# Patient Record
Sex: Female | Born: 1950
Health system: Southern US, Community
[De-identification: ages and names within clinical notes are randomized; demographics above are authoritative.]

## PROBLEM LIST (undated history)

## (undated) DIAGNOSIS — M8580 Other specified disorders of bone density and structure, unspecified site: Secondary | ICD-10-CM

## (undated) DIAGNOSIS — R1011 Right upper quadrant pain: Secondary | ICD-10-CM

## (undated) DIAGNOSIS — K219 Gastro-esophageal reflux disease without esophagitis: Secondary | ICD-10-CM

## (undated) DIAGNOSIS — F17200 Nicotine dependence, unspecified, uncomplicated: Secondary | ICD-10-CM

## (undated) HISTORY — DX: Other specified disorders of bone density and structure, unspecified site: M85.80

## (undated) HISTORY — DX: Gastro-esophageal reflux disease without esophagitis: K21.9

## (undated) HISTORY — DX: Right upper quadrant pain: R10.11

## (undated) HISTORY — DX: Nicotine dependence, unspecified, uncomplicated: F17.200

## (undated) HISTORY — PX: OTHER SURGICAL HISTORY: SHX169

---

## 1999-08-23 ENCOUNTER — Encounter: Payer: Self-pay | Admitting: Gynecology

## 1999-08-23 ENCOUNTER — Encounter: Admission: RE | Admit: 1999-08-23 | Discharge: 1999-08-23 | Payer: Self-pay | Admitting: Gynecology

## 2000-04-05 ENCOUNTER — Other Ambulatory Visit: Admission: RE | Admit: 2000-04-05 | Discharge: 2000-04-05 | Payer: Self-pay | Admitting: Gynecology

## 2000-04-05 ENCOUNTER — Encounter (INDEPENDENT_AMBULATORY_CARE_PROVIDER_SITE_OTHER): Payer: Self-pay

## 2000-08-23 ENCOUNTER — Encounter: Payer: Self-pay | Admitting: Gynecology

## 2000-08-23 ENCOUNTER — Encounter: Admission: RE | Admit: 2000-08-23 | Discharge: 2000-08-23 | Payer: Self-pay | Admitting: Gynecology

## 2001-02-05 ENCOUNTER — Encounter (INDEPENDENT_AMBULATORY_CARE_PROVIDER_SITE_OTHER): Payer: Self-pay | Admitting: Specialist

## 2001-02-05 ENCOUNTER — Other Ambulatory Visit: Admission: RE | Admit: 2001-02-05 | Discharge: 2001-02-05 | Payer: Self-pay | Admitting: Gynecology

## 2001-09-03 ENCOUNTER — Encounter: Payer: Self-pay | Admitting: Gynecology

## 2001-09-03 ENCOUNTER — Encounter: Admission: RE | Admit: 2001-09-03 | Discharge: 2001-09-03 | Payer: Self-pay | Admitting: Gynecology

## 2001-11-11 ENCOUNTER — Other Ambulatory Visit: Admission: RE | Admit: 2001-11-11 | Discharge: 2001-11-11 | Payer: Self-pay | Admitting: Gynecology

## 2002-09-30 ENCOUNTER — Encounter: Payer: Self-pay | Admitting: Gynecology

## 2002-09-30 ENCOUNTER — Encounter: Admission: RE | Admit: 2002-09-30 | Discharge: 2002-09-30 | Payer: Self-pay | Admitting: Gynecology

## 2002-11-18 ENCOUNTER — Other Ambulatory Visit: Admission: RE | Admit: 2002-11-18 | Discharge: 2002-11-18 | Payer: Self-pay | Admitting: Gynecology

## 2003-01-15 ENCOUNTER — Encounter: Payer: Self-pay | Admitting: Internal Medicine

## 2003-01-15 ENCOUNTER — Ambulatory Visit (HOSPITAL_COMMUNITY): Admission: RE | Admit: 2003-01-15 | Discharge: 2003-01-15 | Payer: Self-pay | Admitting: Internal Medicine

## 2003-09-14 ENCOUNTER — Other Ambulatory Visit: Admission: RE | Admit: 2003-09-14 | Discharge: 2003-09-14 | Payer: Self-pay | Admitting: Gynecology

## 2003-10-05 ENCOUNTER — Encounter: Admission: RE | Admit: 2003-10-05 | Discharge: 2003-10-05 | Payer: Self-pay | Admitting: Gynecology

## 2004-07-04 ENCOUNTER — Ambulatory Visit: Payer: Self-pay | Admitting: Internal Medicine

## 2004-10-13 ENCOUNTER — Encounter: Admission: RE | Admit: 2004-10-13 | Discharge: 2004-10-13 | Payer: Self-pay | Admitting: Gynecology

## 2005-03-21 ENCOUNTER — Ambulatory Visit: Payer: Self-pay | Admitting: Internal Medicine

## 2005-03-29 ENCOUNTER — Ambulatory Visit: Payer: Self-pay | Admitting: Internal Medicine

## 2005-03-29 ENCOUNTER — Other Ambulatory Visit: Admission: RE | Admit: 2005-03-29 | Discharge: 2005-03-29 | Payer: Self-pay | Admitting: Gynecology

## 2005-07-21 ENCOUNTER — Ambulatory Visit: Payer: Self-pay | Admitting: Gastroenterology

## 2005-08-10 ENCOUNTER — Ambulatory Visit: Payer: Self-pay | Admitting: Gastroenterology

## 2005-11-16 ENCOUNTER — Encounter: Admission: RE | Admit: 2005-11-16 | Discharge: 2005-11-16 | Payer: Self-pay | Admitting: Gynecology

## 2006-04-26 ENCOUNTER — Other Ambulatory Visit: Admission: RE | Admit: 2006-04-26 | Discharge: 2006-04-26 | Payer: Self-pay | Admitting: Gynecology

## 2006-05-22 ENCOUNTER — Ambulatory Visit: Payer: Self-pay | Admitting: Internal Medicine

## 2006-05-22 LAB — CONVERTED CEMR LAB
ALT: 32 units/L (ref 0–40)
AST: 28 units/L (ref 0–37)
Albumin: 3.7 g/dL (ref 3.5–5.2)
Alkaline Phosphatase: 63 units/L (ref 39–117)
Basophils Absolute: 0 10*3/uL (ref 0.0–0.1)
Basophils Relative: 0.5 % (ref 0.0–1.0)
Bilirubin Urine: NEGATIVE
Calcium: 9.2 mg/dL (ref 8.4–10.5)
Glomerular Filtration Rate, Af Am: 75 mL/min/{1.73_m2}
HCT: 40.7 % (ref 36.0–46.0)
Hemoglobin, Urine: NEGATIVE
LDL DIRECT: 112.7 mg/dL
Leukocytes, UA: NEGATIVE
Lymphocytes Relative: 39.4 % (ref 12.0–46.0)
Monocytes Absolute: 0.6 10*3/uL (ref 0.2–0.7)
Neutrophils Relative %: 42.8 % — ABNORMAL LOW (ref 43.0–77.0)
Potassium: 4.8 meq/L (ref 3.5–5.1)
Sodium: 142 meq/L (ref 135–145)
TSH: 3.01 microintl units/mL (ref 0.35–5.50)
Total Bilirubin: 0.8 mg/dL (ref 0.3–1.2)
Total Protein, Urine: NEGATIVE mg/dL
Total Protein: 6.8 g/dL (ref 6.0–8.3)
Triglyceride fasting, serum: 88 mg/dL (ref 0–149)
Urobilinogen, UA: 0.2 (ref 0.0–1.0)
VLDL: 18 mg/dL (ref 0–40)
WBC: 5.8 10*3/uL (ref 4.5–10.5)

## 2006-06-01 ENCOUNTER — Ambulatory Visit: Payer: Self-pay | Admitting: Internal Medicine

## 2006-11-15 ENCOUNTER — Encounter: Admission: RE | Admit: 2006-11-15 | Discharge: 2006-11-15 | Payer: Self-pay | Admitting: Gynecology

## 2007-05-08 ENCOUNTER — Ambulatory Visit: Payer: Self-pay | Admitting: Internal Medicine

## 2007-05-08 LAB — CONVERTED CEMR LAB
ALT: 28 units/L (ref 0–35)
AST: 25 units/L (ref 0–37)
Albumin: 3.7 g/dL (ref 3.5–5.2)
Alkaline Phosphatase: 66 units/L (ref 39–117)
BUN: 13 mg/dL (ref 6–23)
Basophils Absolute: 0 10*3/uL (ref 0.0–0.1)
Bilirubin, Direct: 0.1 mg/dL (ref 0.0–0.3)
Calcium: 8.8 mg/dL (ref 8.4–10.5)
Cholesterol: 200 mg/dL (ref 0–200)
Creatinine, Ser: 0.9 mg/dL (ref 0.4–1.2)
Eosinophils Relative: 5.8 % — ABNORMAL HIGH (ref 0.0–5.0)
GFR calc Af Amer: 84 mL/min
GFR calc non Af Amer: 69 mL/min
HDL: 72.8 mg/dL (ref >39.0)
Leukocytes, UA: NEGATIVE
Lymphocytes Relative: 40.9 % (ref 12.0–46.0)
MCV: 90.6 fL (ref 78.0–100.0)
Monocytes Relative: 9.7 % (ref 3.0–11.0)
Neutrophils Relative %: 42.7 % — ABNORMAL LOW (ref 43.0–77.0)
Nitrite: NEGATIVE
Platelets: 251 10*3/uL (ref 150–400)
RDW: 11.9 % (ref 11.5–14.6)
Sodium: 144 meq/L (ref 135–145)
Total Protein, Urine: NEGATIVE mg/dL
Total Protein: 6.6 g/dL (ref 6.0–8.3)
Triglycerides: 57 mg/dL (ref 0–149)
VLDL: 11 mg/dL (ref 0–40)
Vit D, 1,25-Dihydroxy: 24 (ref 20–57)
pH: 7 (ref 5.0–8.0)

## 2007-05-10 ENCOUNTER — Ambulatory Visit: Payer: Self-pay | Admitting: Internal Medicine

## 2007-05-15 ENCOUNTER — Encounter: Payer: Self-pay | Admitting: Internal Medicine

## 2007-05-15 ENCOUNTER — Ambulatory Visit: Payer: Self-pay | Admitting: Internal Medicine

## 2007-07-11 ENCOUNTER — Other Ambulatory Visit: Admission: RE | Admit: 2007-07-11 | Discharge: 2007-07-11 | Payer: Self-pay | Admitting: Gynecology

## 2007-12-09 ENCOUNTER — Encounter: Admission: RE | Admit: 2007-12-09 | Discharge: 2007-12-09 | Payer: Self-pay | Admitting: Gynecology

## 2008-06-25 ENCOUNTER — Other Ambulatory Visit: Admission: RE | Admit: 2008-06-25 | Discharge: 2008-06-25 | Payer: Self-pay | Admitting: Gynecology

## 2008-06-25 ENCOUNTER — Ambulatory Visit: Payer: Self-pay | Admitting: Internal Medicine

## 2008-06-25 LAB — CONVERTED CEMR LAB
Basophils Absolute: 0 10*3/uL (ref 0.0–0.1)
Basophils Relative: 0.7 % (ref 0.0–3.0)
CO2: 27 meq/L (ref 19–32)
Calcium: 9.3 mg/dL (ref 8.4–10.5)
Chloride: 105 meq/L (ref 96–112)
Eosinophils Relative: 6.3 % — ABNORMAL HIGH (ref 0.0–5.0)
HCT: 38.1 % (ref 36.0–46.0)
Hemoglobin, Urine: NEGATIVE
Leukocytes, UA: NEGATIVE
Lymphocytes Relative: 36.6 % (ref 12.0–46.0)
Monocytes Absolute: 0.5 10*3/uL (ref 0.1–1.0)
Neutrophils Relative %: 46.8 % (ref 43.0–77.0)
Platelets: 250 10*3/uL (ref 150–400)
Potassium: 5 meq/L (ref 3.5–5.1)
RDW: 12.1 % (ref 11.5–14.6)
Sodium: 145 meq/L (ref 135–145)
Specific Gravity, Urine: 1.015 (ref 1.000–1.03)
TSH: 2.09 microintl units/mL (ref 0.35–5.50)
Total Bilirubin: 0.6 mg/dL (ref 0.3–1.2)
Total CHOL/HDL Ratio: 2.5
Total Protein: 6.6 g/dL (ref 6.0–8.3)
Urine Glucose: NEGATIVE mg/dL
Urobilinogen, UA: 0.2 (ref 0.0–1.0)
pH: 6 (ref 5.0–8.0)

## 2008-06-30 ENCOUNTER — Ambulatory Visit: Payer: Self-pay | Admitting: Internal Medicine

## 2008-06-30 DIAGNOSIS — H9190 Unspecified hearing loss, unspecified ear: Secondary | ICD-10-CM | POA: Insufficient documentation

## 2008-06-30 DIAGNOSIS — Z87891 Personal history of nicotine dependence: Secondary | ICD-10-CM

## 2008-06-30 DIAGNOSIS — R1011 Right upper quadrant pain: Secondary | ICD-10-CM | POA: Insufficient documentation

## 2008-07-01 DIAGNOSIS — M8580 Other specified disorders of bone density and structure, unspecified site: Secondary | ICD-10-CM | POA: Insufficient documentation

## 2008-07-07 ENCOUNTER — Encounter: Payer: Self-pay | Admitting: Internal Medicine

## 2008-07-10 ENCOUNTER — Encounter: Admission: RE | Admit: 2008-07-10 | Discharge: 2008-07-10 | Payer: Self-pay | Admitting: Internal Medicine

## 2008-07-13 ENCOUNTER — Telehealth: Payer: Self-pay | Admitting: Internal Medicine

## 2008-12-17 ENCOUNTER — Encounter: Admission: RE | Admit: 2008-12-17 | Discharge: 2008-12-17 | Payer: Self-pay | Admitting: Gynecology

## 2009-05-04 ENCOUNTER — Ambulatory Visit: Payer: Self-pay | Admitting: Internal Medicine

## 2009-05-17 ENCOUNTER — Telehealth: Payer: Self-pay | Admitting: Internal Medicine

## 2009-09-15 ENCOUNTER — Ambulatory Visit: Payer: Self-pay | Admitting: Internal Medicine

## 2009-09-15 LAB — CONVERTED CEMR LAB
ALT: 36 units/L — ABNORMAL HIGH (ref 0–35)
AST: 29 units/L (ref 0–37)
Alkaline Phosphatase: 60 units/L (ref 39–117)
BUN: 12 mg/dL (ref 6–23)
Bilirubin Urine: NEGATIVE
CO2: 30 meq/L (ref 19–32)
Calcium: 9 mg/dL (ref 8.4–10.5)
Chloride: 109 meq/L (ref 96–112)
Creatinine, Ser: 0.9 mg/dL (ref 0.4–1.2)
Eosinophils Absolute: 0.3 10*3/uL (ref 0.0–0.7)
Eosinophils Relative: 5.4 % — ABNORMAL HIGH (ref 0.0–5.0)
HCT: 39.9 % (ref 36.0–46.0)
HDL: 69.4 mg/dL (ref >39.00)
Hemoglobin, Urine: NEGATIVE
Hemoglobin: 13.4 g/dL (ref 12.0–15.0)
Ketones, ur: NEGATIVE mg/dL
LDL Cholesterol: 98 mg/dL (ref 0–99)
Leukocytes, UA: NEGATIVE
Lymphocytes Relative: 40.1 % (ref 12.0–46.0)
Monocytes Absolute: 0.5 10*3/uL (ref 0.1–1.0)
Monocytes Relative: 10.4 % (ref 3.0–12.0)
Neutrophils Relative %: 43.1 % (ref 43.0–77.0)
RDW: 12.2 % (ref 11.5–14.6)
Total Bilirubin: 0.5 mg/dL (ref 0.3–1.2)
Total Protein, Urine: NEGATIVE mg/dL
Triglycerides: 81 mg/dL (ref 0.0–149.0)
VLDL: 16.2 mg/dL (ref 0.0–40.0)
Vit D, 25-Hydroxy: 32 ng/mL (ref 30–89)
WBC: 4.9 10*3/uL (ref 4.5–10.5)
pH: 6.5 (ref 5.0–8.0)

## 2009-09-24 ENCOUNTER — Ambulatory Visit: Payer: Self-pay | Admitting: Internal Medicine

## 2009-10-01 ENCOUNTER — Encounter: Payer: Self-pay | Admitting: Internal Medicine

## 2009-10-08 ENCOUNTER — Encounter: Admission: RE | Admit: 2009-10-08 | Discharge: 2009-10-08 | Payer: Self-pay | Admitting: Internal Medicine

## 2009-10-19 ENCOUNTER — Telehealth (INDEPENDENT_AMBULATORY_CARE_PROVIDER_SITE_OTHER): Payer: Self-pay | Admitting: *Deleted

## 2009-10-22 ENCOUNTER — Encounter: Payer: Self-pay | Admitting: Internal Medicine

## 2009-12-28 ENCOUNTER — Encounter: Admission: RE | Admit: 2009-12-28 | Discharge: 2009-12-28 | Payer: Self-pay | Admitting: Gynecology

## 2010-08-12 ENCOUNTER — Ambulatory Visit (HOSPITAL_COMMUNITY)
Admission: RE | Admit: 2010-08-12 | Discharge: 2010-08-12 | Payer: Self-pay | Source: Home / Self Care | Attending: Obstetrics and Gynecology | Admitting: Obstetrics and Gynecology

## 2010-08-26 ENCOUNTER — Encounter: Payer: Self-pay | Admitting: Gastroenterology

## 2010-09-08 NOTE — Letter (Signed)
Summary: Generic Letter  [facility]  [address] [place], [place] [address]   Phone: [phone]  Fax: [phone]    10/22/2009  [facility] [address] [place], [place]  [address]  Dear Ms. Brine,  Summary of Call: Please call patient: U/S ia ngeative for gallstone. For continued pain will need to have a heptao-biliary scan to look at the function of the gallbladder.  Please Call our office to have this testing set up.          Sincerely,   Ami Bullins CMA

## 2010-09-08 NOTE — Letter (Signed)
Summary: Colonoscopy Date Change Letter  [facility]  [address] [place], [place] [address]   Phone: [phone]  Fax: [phone]      August 26, 2010 MRN: [id number]   [facility] [address] [place], [place]  [address]   Dear Ms. Rester,   Previously you were recommended to have a repeat colonoscopy around this time. Your chart was recently reviewed by DR.Hadia Minier of  Gastroenterology. Follow up colonoscopy is now recommended in JAN 2017. This revised recommendation is based on current, nationally recognized guidelines for colorectal cancer screening and polyp surveillance. These guidelines are endorsed by the American Cancer Society, [employer] on Colorectal Cancer as well as numerous other major medical organizations.  Please understand that our recommendation assumes that you do not have any new symptoms such as bleeding, a change in bowel habits, anemia, or significant abdominal discomfort. If you do have any concerning GI symptoms or want to discuss the guideline recommendations, please call to arrange an office visit at your earliest convenience. Otherwise we will keep you in our reminder system and contact you 1-2 months prior to the date listed above to schedule your next colonoscopy.  Thank you,  Rachael Fee, M.D.  [facility] [phone]

## 2010-09-08 NOTE — Progress Notes (Signed)
  Phone Note Outgoing Call   Reason for Call: Discuss lab or test results Summary of Call: Please call patient: U/S ia ngeative for gallstone. For continued pain will need to have a heptao-biliary scan to look at the function of the gallbladder.  Thanks Initial call taken by: Jacques Navy MD,  October 19, 2009 5:20 PM  Follow-up for Phone Call        lmoam for pt to call back Follow-up by: Ami Bullins CMA,  October 20, 2009 10:02 AM

## 2010-09-08 NOTE — Letter (Signed)
Summary: Narda Bonds MD- ENT  Narda Bonds MD- ENT   Imported By: Sherian Rein 10/25/2009 10:32:40  _____________________________________________________________________  External Attachment:    Type:   Image     Comment:   External Document

## 2010-09-08 NOTE — Assessment & Plan Note (Signed)
Summary: CPX / NWS  #   Vital Signs:  Patient profile:   60 year old female Height:      67 inches Weight:      197 pounds BMI:     30.97 O2 Sat:      97 % on Room air Temp:     97.7 degrees F oral Pulse rate:   65 / minute BP sitting:   100 / 62  (left arm) Cuff size:   regular  Vitals Entered By: Bill Salinas CMA (September 24, 2009 1:25 PM)  O2 Flow:  Room air CC: pt here for cpx, she is due for mammogram and pap in april/ ab   Primary Care Provider:  Sorina Derrig  CC:  pt here for cpx and she is due for mammogram and pap in april/ ab.  History of Present Illness: Patient presents for routine exam. she has been doing well. She is concerned about her weight and looks forward to better weather. In the interval she has had steroid injection foot by Dr. Charlsie Merles - and this did help. No other problems. Due for Mammogram in April as is her Gyn exam.   She has had several episodes of reflux and also RUQ pain, fatty food intolerance with N/V, change in stool color. Father and sister have had GB disease.   Current Medications (verified): 1)  Multivitamins   Tabs (Multiple Vitamin) .... Take 1 By Mouth Qd 2)  Vitamin E 400 Unit Caps (Vitamin E) .... Take 1 Tablet By Mouth Once A Day 3)  Vitamin D 2000 Unit Tabs (Cholecalciferol) .... 6,000 Unit Daily 4)  Calcium 500 Mg Tabs (Calcium Carbonate) .... Take 2 Tablet By Mouth Once A Day 5)  Magnesium 500 Mg Tabs (Magnesium) .... As Needed  Allergies (verified): No Known Drug Allergies  Past History:  Past Medical History: Last updated: 06/30/2008 UCD OSTEOPENIA (ICD-733.90) Hx of TOBACCO ABUSE (ICD-305.1) HEARING LOSS (ICD-389.9) ABDOMINAL PAIN, RIGHT UPPER QUADRANT (ICD-789.01)   hysician roster: Dr. Leodis Rains for gynecology, Dr. Emily Filbert for dermatology.  Past Surgical History: Last updated: 05/17/2007 laporoscopy - fertility work-up  Family History: Last updated: 05-17-2007 father - died melanoma, mother-healthy brothers -healthy 3  sisters-healthy  Social History: Last updated: May 17, 2007 college graduate in Biology  Works- dental office married - 1989 things are going well  Review of Systems       More fatigued. Occasional pain in the right chest with referral to back. Not exertional. She has had a couple of severe heartburn and has had several episodes since, less severe.   Physical Exam  General:  WNWD softique female in no distress Head:  normocephalic, atraumatic, and no abnormalities observed.   Eyes:  vision grossly intact, pupils equal, pupils round, corneas and lenses clear, no injection, and no optic disk abnormalities.   Ears:  R ear normal and L ear normal.  Small nodule top of the right pinnae but no visible lesion  Nose:  no external deformity and no external erythema.   Mouth:  Oral mucosa and oropharynx without lesions or exudates.  Teeth in good repair. Neck:  full ROM, no thyromegaly, and no carotid bruits.  Easily palpable right submandibular salivary gland but no nodules or enlarged lymph nodes  Chest Wall:  no deformities.   Breasts:  deferred to gyn  Lungs:  Normal respiratory effort, chest expands symmetrically. Lungs are clear to auscultation, no crackles or wheezes. Heart:  Normal rate and regular rhythm. S1 and S2 normal without gallop, murmur,  click, rub or other extra sounds. Abdomen:  soft, normal bowel sounds, no masses, no guarding, and no rigidity.  Tender to deep palpation in the RUQ, tender to percussion that caused pain in the back.  Genitalia:  deferred   Impression & Recommendations:  Problem # 1:  ABDOMINAL PAIN, RIGHT UPPER QUADRANT (ICD-789.01) Recurrent RUQ pain with faty food intolerance, radiation of pain to the back, lighter stools. She has a positive family history for GB disease sister and father. She does have RUQ tenderness on exam. GB u/s 18+ months ago was normal.  Plan - repeat GB u/s           if negative will obtain HIDA scan  Orders: Radiology Referral  (Radiology)  Problem # 2:  HEARING LOSS (ICD-389.9) Significant hearing loss left ear. she has seen Dr. Dorma Russell but she is dissatisfied with the interaction and feels she would like another opinion about the etiology of her hearing loss and any potential treatment.  Plan - referral to Dr. Narda Bonds  Orders: ENT Referral (ENT)  Problem # 3:  Preventive Health Care (ICD-V70.0) She does remain current with her gynecologist for well-woman exams. Her exam today is normal. Lab results are within normal limits. Her last colonoscopy was in '07. She has had flu shot. Tetnus status is unkown.  In summary - a very nice woman who appears to be medically stable except for RUQ abdominal pain, work-up underway.   Complete Medication List: 1)  Multivitamins Tabs (Multiple vitamin) .... Take 1 by mouth qd 2)  Vitamin E 400 Unit Caps (Vitamin e) .... Take 1 tablet by mouth once a day 3)  Vitamin D 2000 Unit Tabs (Cholecalciferol) .... 6,000 unit daily 4)  Calcium 500 Mg Tabs (Calcium carbonate) .... Take 2 tablet by mouth once a day 5)  Magnesium 500 Mg Tabs (Magnesium) .... As needed   Patient: Sharon Mcgee Note: All result statuses are Final unless otherwise noted.  Tests: (1) BMP (METABOL)   Sodium                    144 mEq/L                   135-145   Potassium                 4.4 mEq/L                   3.5-5.1   Chloride                  109 mEq/L                   96-112   Carbon Dioxide            30 mEq/L                    19-32   Glucose                   91 mg/dL                    16-10   BUN                       12 mg/dL                    9-60   Creatinine  0.9 mg/dL                   0.4-5.4   Calcium                   9.0 mg/dL                   0.9-81.1   GFR                       68.66 mL/min                >60  Tests: (2) Lipid Panel (LIPID)   Cholesterol               184 mg/dL                   9-147     ATP III Classification            Desirable:  < 200  mg/dL                    Borderline High:  200 - 239 mg/dL               High:  > = 240 mg/dL   Triglycerides             81.0 mg/dL                  8.2-956.2     Normal:  <150 mg/dL     Borderline High:  130 - 199 mg/dL   HDL                       86.57 mg/dL                 >84.69   VLDL Cholesterol          16.2 mg/dL                  6.2-95.2   LDL Cholesterol           98 mg/dL                    8-41  CHO/HDL Ratio:  CHD Risk                             3                    Men          Women     1/2 Average Risk     3.4          3.3     Average Risk          5.0          4.4     2X Average Risk          9.6          7.1     3X Average Risk          15.0          11.0                           Tests: (3) CBC Platelet w/Diff (CBCD)   White Cell Count  4.9 K/uL                    4.5-10.5   Red Cell Count            4.20 Mil/uL                 3.87-5.11   Hemoglobin                13.4 g/dL                   40.9-81.1   Hematocrit                39.9 %                      36.0-46.0   MCV                       95.0 fl                     78.0-100.0   MCHC                      33.7 g/dL                   91.4-78.2   RDW                       12.2 %                      11.5-14.6   Platelet Count            245.0 K/uL                  150.0-400.0   Neutrophil %              43.1 %                      43.0-77.0   Lymphocyte %              40.1 %                      12.0-46.0   Monocyte %                10.4 %                      3.0-12.0   Eosinophils%         [H]  5.4 %                       0.0-5.0   Basophils %               1.0 %                       0.0-3.0   Neutrophill Absolute      2.1 K/uL                    1.4-7.7   Lymphocyte Absolute       2.0 K/uL                    0.7-4.0   Monocyte Absolute         0.5 K/uL  0.1-1.0  Eosinophils, Absolute                             0.3 K/uL                    0.0-0.7   Basophils Absolute         0.0 K/uL                    0.0-0.1  Tests: (4) Hepatic/Liver Function Panel (HEPATIC)   Total Bilirubin           0.5 mg/dL                   1.6-1.0   Direct Bilirubin          0.1 mg/dL                   9.6-0.4   Alkaline Phosphatase      60 U/L                      39-117   AST                       29 U/L                      0-37   ALT                  [H]  36 U/L                      0-35   Total Protein             7.1 g/dL                    5.4-0.9   Albumin                   4.0 g/dL                    8.1-1.9  Tests: (5) TSH (TSH)   FastTSH                   2.09 uIU/mL                 0.35-5.50  Tests: (6) UDip Only (UDIP)   Color                     DK. YELLOW       RANGE:  Yellow;Lt. Yellow   Clarity                   CLEAR                       Clear   Specific Gravity          1.015                       1.000 - 1.030   Urine Ph                  6.5                         5.0-8.0   Protein  NEGATIVE                    Negative   Urine Glucose             NEGATIVE                    Negative   Ketones                   NEGATIVE                    Negative   Urine Bilirubin           NEGATIVE                    Negative   Blood                     NEGATIVE                    Negative   Urobilinogen              0.2                         0.0 - 1.0   Leukocyte Esterace        NEGATIVE                    Negative   Nitrite                   NEGATIVE                    Negative Tests: (1) Vitamin D (25-Hydroxy) (11914)  Vitamin D (25-Hydroxy)                             32 ng/mL                    30-89     This assay accurately quantifies Vitamin D, which is the sum of the     25-Hydroxy forms of Vitamin D2 and D3.  Studies have shown that the     optimum concentration of 25-Hydroxy Vitamin D is 30 ng/mL or higher.      Concentrations of Vitamin D between 20 and 29 ng/mL are considered to     be insufficient and concentrations less than 20 ng/mL are  considered     to be deficient for Vitamin D.     Immunization History:  Influenza Immunization History:    Influenza:  historical (06/07/2009)

## 2010-09-22 ENCOUNTER — Encounter: Payer: Self-pay | Admitting: Internal Medicine

## 2010-09-22 ENCOUNTER — Other Ambulatory Visit: Payer: Self-pay

## 2010-09-22 ENCOUNTER — Encounter (INDEPENDENT_AMBULATORY_CARE_PROVIDER_SITE_OTHER): Payer: Self-pay | Admitting: *Deleted

## 2010-09-22 ENCOUNTER — Other Ambulatory Visit: Payer: Self-pay | Admitting: Internal Medicine

## 2010-09-22 DIAGNOSIS — Z0000 Encounter for general adult medical examination without abnormal findings: Secondary | ICD-10-CM

## 2010-09-22 DIAGNOSIS — E785 Hyperlipidemia, unspecified: Secondary | ICD-10-CM

## 2010-09-22 LAB — LIPID PANEL: Triglycerides: 131 mg/dL (ref 0.0–149.0)

## 2010-09-22 LAB — URINALYSIS
Bilirubin Urine: NEGATIVE
Ketones, ur: NEGATIVE
Leukocytes, UA: NEGATIVE
Total Protein, Urine: NEGATIVE
Urine Glucose: NEGATIVE
Urobilinogen, UA: 0.2 (ref 0.0–1.0)

## 2010-09-22 LAB — HEPATIC FUNCTION PANEL
ALT: 29 U/L (ref 0–35)
Albumin: 4 g/dL (ref 3.5–5.2)
Alkaline Phosphatase: 58 U/L (ref 39–117)
Bilirubin, Direct: 0.1 mg/dL (ref 0.0–0.3)
Total Protein: 6.8 g/dL (ref 6.0–8.3)

## 2010-09-22 LAB — CBC WITH DIFFERENTIAL/PLATELET
Basophils Relative: 0.5 % (ref 0.0–3.0)
Eosinophils Absolute: 0.3 10*3/uL (ref 0.0–0.7)
HCT: 40.1 % (ref 36.0–46.0)
Lymphocytes Relative: 39.9 % (ref 12.0–46.0)
Lymphs Abs: 2.3 10*3/uL (ref 0.7–4.0)
MCHC: 34.2 g/dL (ref 30.0–36.0)
MCV: 93 fl (ref 78.0–100.0)
Monocytes Absolute: 0.6 10*3/uL (ref 0.1–1.0)
Monocytes Relative: 10 % (ref 3.0–12.0)
Neutrophils Relative %: 43.7 % (ref 43.0–77.0)
Platelets: 237 10*3/uL (ref 150.0–400.0)
RDW: 12.9 % (ref 11.5–14.6)
WBC: 5.9 10*3/uL (ref 4.5–10.5)

## 2010-09-22 LAB — BASIC METABOLIC PANEL
Creatinine, Ser: 0.8 mg/dL (ref 0.4–1.2)
Potassium: 5.2 mEq/L — ABNORMAL HIGH (ref 3.5–5.1)
Sodium: 143 mEq/L (ref 135–145)

## 2010-09-22 LAB — CONVERTED CEMR LAB: Vit D, 25-Hydroxy: 30 ng/mL (ref 30–89)

## 2010-09-29 ENCOUNTER — Encounter: Payer: Self-pay | Admitting: Internal Medicine

## 2010-09-29 ENCOUNTER — Encounter (INDEPENDENT_AMBULATORY_CARE_PROVIDER_SITE_OTHER): Payer: BC Managed Care – PPO | Admitting: Internal Medicine

## 2010-09-29 DIAGNOSIS — K219 Gastro-esophageal reflux disease without esophagitis: Secondary | ICD-10-CM

## 2010-09-29 DIAGNOSIS — Z0000 Encounter for general adult medical examination without abnormal findings: Secondary | ICD-10-CM

## 2010-10-13 NOTE — Assessment & Plan Note (Signed)
Summary: PHYSICAL  ---STC   Vital Signs:  Patient profile:   60 year old female Height:      65.50 inches (166.37 cm) Weight:      197.75 pounds (89.89 kg) BMI:     32.52 O2 Sat:      96 % on Room air Temp:     98.7 degrees F (37.06 degrees C) oral Pulse rate:   69 / minute Resp:     14 per minute BP sitting:   102 / 68  (left arm) Cuff size:   regular  Vitals Entered By: Burnard Leigh CMA(AAMA) (September 29, 2010 1:54 PM)  O2 Flow:  Room air CC: Annual Wellness Exam/sls,cma Is Patient Diabetic? No Comments Pt states that she has been having worsening esophageal reflux, but it is under control w/OTC Zantac.   Primary Care Provider:  Yohannes Waibel  CC:  Annual Wellness Exam/sls and cma.  History of Present Illness: Sharon Mcgee presents for annual medical exam. In the interval she was evaluated for abdominal pain. This lead to hysteroscopy with removal of several bengin polyps and no complications.   She reports thatt she has been having increased reflux with several espisodes regurgitation. She denies dysphagia.   For health maintenance she has seen her gynecologist with a normal PAP. She had normal breast exam. She does have some trouble with stress incontinence.   Current Medications (verified): 1)  Multivitamins   Tabs (Multiple Vitamin) .... Take 1 By Mouth Qd 2)  Vitamin E 400 Unit Caps (Vitamin E) .... Take 1 Tablet By Mouth Once A Day 3)  Vitamin D 2000 Unit Tabs (Cholecalciferol) .... 6,000 Unit Daily 4)  Calcium 500 Mg Tabs (Calcium Carbonate) .... Take 2 Tablet By Mouth Once A Day 5)  Magnesium 500 Mg Tabs (Magnesium) .... As Needed 6)  Zantac 75 75 Mg Tabs (Ranitidine Hcl) .... Take As Needed.  Allergies (verified): No Known Drug Allergies  Past History:  Past Medical History: Last updated: 06/30/2008 UCD OSTEOPENIA (ICD-733.90) Hx of TOBACCO ABUSE (ICD-305.1) HEARING LOSS (ICD-389.9) ABDOMINAL PAIN, RIGHT UPPER QUADRANT (ICD-789.01)   hysician roster: Dr.  Leodis Rains for gynecology, Dr. Emily Filbert for dermatology.  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    Physical Exam  General:  Well-developed,well-nourished,in no acute distress; alert,appropriate and cooperative throughout examination Head:  Normocephalic and atraumatic without obvious abnormalities. No apparent alopecia or balding. Eyes:  vision grossly intact, pupils equal, pupils round, and corneas and lenses clear.   Ears:  External ear exam shows no significant lesions or deformities.  Otoscopic examination reveals minor amount of cerumen right and clear left, tympanic membranes are intact bilaterally without bulging, retraction, inflammation or discharge. Hearing is grossly normal bilaterally. Nose:  no external deformity, no external erythema, and no nasal discharge.   Mouth:  Oral mucosa and oropharynx without lesions or exudates.  Teeth in good repair. Neck:  supple, full ROM, no thyromegaly, and no carotid bruits.   Breasts:  deferred to gyn Lungs:  Normal respiratory effort,  chest expands symmetrically. Lungs are clear to auscultation, no crackles or wheezes. Heart:  Normal rate and regular rhythm. S1 and S2 normal without gallop, murmur, click, rub or other extra sounds. Abdomen:  soft, normal bowel sounds, and no guarding.   Genitalia:  deferred to  gyn Msk:  normal ROM, no joint tenderness, no joint swelling, no redness over joints, and no joint instability.   Pulses:  2+ radial Extremities:  No clubbing, cyanosis, edema, or deformity noted with normal full range of motion of all joints.   Neurologic:  alert & oriented X3, cranial nerves II-XII intact, strength normal in all extremities, sensation intact to pinprick, gait normal, and DTRs symmetrical and normal.   Skin:  turgor normal, color normal, and no suspicious lesions.   Cervical Nodes:  no anterior cervical adenopathy and no posterior cervical adenopathy.   Psych:  Oriented X3, memory intact for recent and remote, normally interactive, and not anxious appearing.     Impression & Recommendations:  Problem # 1:  GERD (ICD-530.81) Patient with significant symptoms that are intermittent.  Plan - liquid antacids with simethoicon           otc H2 blockers.           for unrelieved discomfort or increased frequency of symptoms - GI consult.  Her updated medication list for this problem includes:    Zantac 75 75 Mg Tabs (Ranitidine hcl) .[name]... Take as needed.  Problem # 2:  Preventive Health Care (ICD-V70.0) History remarkable for hysteroscopy with polyp0 removal as noted and new GI problems. Her limited physical exam was unremarkable. Lab results are within normal limits with an LDL 129.3 with goal of 130 or less (NCEP ATP III). She is currrent with colonsocopy Jan '07. She is current with gyn. She has had flu shot this season. 12 lead EKG without evidence of ischemia or injury.  In summary- a very nice woman who appears to be healthy. She is advised to increase he regular exercise and to be prudent in her diet. She will return as needed or in 1 year.   Complete Medication List: 1)  Multivitamins Tabs (Multiple vitamin) .... Take 1 by mouth qd 2)  Vitamin E 400 Unit Caps (Vitamin e) .... Take 1 tablet by mouth once a day 3)  Vitamin D 2000 Unit Tabs (Cholecalciferol) .... 6,000  unit daily 4)  Calcium 500 Mg Tabs (Calcium carbonate) .... Take 2 tablet by mouth once a day 5)  Magnesium 500 Mg Tabs (Magnesium) .... As needed 6)  Zantac 75 75 Mg Tabs (Ranitidine hcl) .... Take as needed.  Patient: Sharon Mcgee Note: All result statuses are Final unless otherwise noted.  Tests: (1) BMP (METABOL)   Sodium                    143 mEq/L                   135-145   Potassium            [H]  5.2 mEq/L                   3.5-5.1   Chloride                  109 mEq/L                   96-112   Carbon Dioxide  29 mEq/L                    19-32   Glucose                   90 mg/dL                    62-13   BUN                       14 mg/dL                    0-86   Creatinine                0.8 mg/dL                   5.7-8.4   Calcium                   9.3 mg/dL                   6.9-62.9   GFR                       84.43 mL/min                >60.00  Tests: (2) CBC Platelet w/Diff (CBCD)   White Cell Count          5.9 K/uL                    4.5-10.5   Red Cell Count            4.31 Mil/uL                 3.87-5.11   Hemoglobin                13.7 g/dL                   52.8-41.3   Hematocrit                40.1 %                      36.0-46.0   MCV                       93.0 fl                     78.0-100.0   MCHC                      34.2 g/dL                   24.4-01.0   RDW                       12.9 %                      11.5-14.6   Platelet Count            237.0 K/uL                  150.0-400.0   Neutrophil %              43.7 %  43.0-77.0   Lymphocyte %              39.9 %                      12.0-46.0   Monocyte %                10.0 %                      3.0-12.0   Eosinophils%         [H]  5.9 %                       0.0-5.0   Basophils %               0.5 %                       0.0-3.0   Neutrophill Absolute      2.6 K/uL                    1.4-7.7   Lymphocyte Absolute       2.3 K/uL                    0.7-4.0    Monocyte Absolute         0.6 K/uL                    0.1-1.0  Eosinophils, Absolute                             0.3 K/uL                    0.0-0.7   Basophils Absolute        0.0 K/uL                    0.0-0.1  Tests: (3) Hepatic/Liver Function Panel (HEPATIC)   Total Bilirubin           0.6 mg/dL                   1.6-1.0   Direct Bilirubin          0.1 mg/dL                   9.6-0.4   Alkaline Phosphatase      58 U/L                      39-117   AST                       26 U/L                      0-37   ALT                       29 U/L                      0-35   Total Protein             6.8 g/dL                    5.4-0.9   Albumin  4.0 g/dL                    1.6-1.0  Tests: (4) TSH (TSH)   FastTSH                   2.06 uIU/mL                 0.35-5.50  Tests: (5) Lipid Panel (LIPID)   Cholesterol          [H]  218 mg/dL                   9-604     ATP III Classification            Desirable:  < 200 mg/dL                    Borderline High:  200 - 239 mg/dL               High:  > = 240 mg/dL   Triglycerides             131.0 mg/dL                 5.4-098.1     Normal:  <150 mg/dL     Borderline High:  191 - 199 mg/dL   HDL                       47.82 mg/dL                 >95.62   VLDL Cholesterol          26.2 mg/dL                  1.3-08.6  CHO/HDL Ratio:  CHD Risk                             3                    Men          Women     1/2 Average Risk     3.4          3.3     Average Risk          5.0          4.4     2X Average Risk          9.6          7.1     3X Average Risk          15.0          11.0                           Tests: (6) UDip Only (UDIP)   Color                     YELLOW       RANGE:  Yellow;Lt. Yellow   Clarity                   CLEAR                       Clear   Specific Gravity          >=1.030  1.000 - 1.030   Urine Ph                  6.0                         5.0-8.0   Protein                    NEGATIVE                    Negative   Urine Glucose             NEGATIVE                    Negative   Ketones                   NEGATIVE                    Negative   Urine Bilirubin           NEGATIVE                    Negative   Blood                     NEGATIVE                    Negative   Urobilinogen              0.2                         0.0 - 1.0   Leukocyte Esterace        NEGATIVE                    Negative   Nitrite                   NEGATIVE                    Negative  Tests: (7) Cholesterol LDL - Direct (DIRLDL)  Cholesterol LDL - Direct                             129.4 mg/dL     Optimal:  <161 mg/dL     Near or Above Optimal:  100-129 mg/dL     Borderline High:  096-045 mg/dL     High:  409-811 mg/dL Tests: (1) Vitamin D (91-YNWGNFA) (21308)  Vitamin D (25-Hydroxy)                             30 ng/mL                    30-89  Orders Added: 1)  Est. Patient 40-64 years [99396] 2)  Est. Patient Level II [65784]

## 2010-10-17 LAB — CBC
MCH: 30.8 pg (ref 26.0–34.0)
MCV: 90.4 fL (ref 78.0–100.0)
Platelets: 233 10*3/uL (ref 150–400)
RBC: 4.25 MIL/uL (ref 3.87–5.11)
WBC: 7 10*3/uL (ref 4.0–10.5)

## 2010-10-24 ENCOUNTER — Ambulatory Visit (INDEPENDENT_AMBULATORY_CARE_PROVIDER_SITE_OTHER): Payer: BC Managed Care – PPO | Admitting: Internal Medicine

## 2010-10-24 ENCOUNTER — Encounter: Payer: Self-pay | Admitting: Internal Medicine

## 2010-10-24 DIAGNOSIS — J019 Acute sinusitis, unspecified: Secondary | ICD-10-CM

## 2010-11-03 NOTE — Assessment & Plan Note (Signed)
Summary: COUGH--FEVER---STC   Vital Signs:  Patient profile:   60 year old female Height:      65.50 inches Weight:      192 pounds BMI:     31.58 O2 Sat:      95 % on Room air Temp:     100.9 degrees F oral Pulse rate:   103 / minute BP sitting:   108 / 64  (left arm) Cuff size:   regular  Vitals Entered By: Bill Salinas CMA (October 24, 2010 10:32 AM)  O2 Flow:  Room air CC: pt here with 2 day history of fever (102), congestion, runny nose, sneezing, cold chills, weakness, headaches and body aches/ ab  Does patient need assistance? Ambulation Normal   Primary Care Provider:  Roselind Klus  CC:  pt here with 2 day history of fever (102), congestion, runny nose, sneezing, cold chills, weakness, and headaches and body aches/ ab.  History of Present Illness: Patient presents for a 3 day h/o URI symtoms with an increased fever over the past 24 hrs to 102F. She has started a Z-pak with 500mg  yesterday. she continues to have a cough, myalgias and congestion. No N/D/V  Current Medications (verified): 1)  Multivitamins   Tabs (Multiple Vitamin) .... Take 1 By Mouth Qd 2)  Vitamin E 400 Unit Caps (Vitamin E) .... Take 1 Tablet By Mouth Once A Day 3)  Vitamin D 2000 Unit Tabs (Cholecalciferol) .... 6,000 Unit Daily 4)  Calcium 500 Mg Tabs (Calcium Carbonate) .... Take 2 Tablet By Mouth Once A Day 5)  Magnesium 500 Mg Tabs (Magnesium) .... As Needed 6)  Zantac 75 75 Mg Tabs (Ranitidine Hcl) .... Take As Needed.  Allergies (verified): No Known Drug Allergies PMH-FH-SH reviewed-no changes except otherwise noted  Review of Systems  The patient denies anorexia, fever, weight loss, decreased hearing, chest pain, dyspnea on exertion, prolonged cough, abdominal pain, muscle weakness, difficulty walking, and enlarged lymph nodes.    Physical Exam  General:  Well-developed,well-nourished,in no acute distress; alert,appropriate and cooperative throughout examination Head:  minimal tenderness to  percussion over the sinuses Eyes:  vision grossly intact, pupils equal, and pupils round.   Ears:  R ear normal and L ear normal.   Neck:  supple and full ROM.   Chest Wall:  No deformities, masses, or tenderness noted. Lungs:  slight wheeze RUL. Otherwise normal exam Heart:  normal rate and regular rhythm.     Impression & Recommendations:  Problem # 1:  SINUSITIS, ACUTE (ICD-461.9) patient with fever and sinus congestion, but also arthraliga and myalgias. She has clear rhinorrhea. concerned for sinustis but also possible influenza like illness.  Plan - complete z-pak           Robitussin DM or equivalent; pehn/cod 1 tsp q6 for nighttime use or uncontrolled cough           Tamiflu 75mg  two times a day x 5           Usual comfort care.          Her updated medication list for this problem includes:    Zithromax Z-pak 250 Mg Tabs (Azithromycin)    Promethazine-codeine 6.25-10 Mg/29ml Syrp (Promethazine-codeine) .[name]... 1 tsp q6 as needed cough  Complete Medication List: 1)  Multivitamins Tabs (Multiple vitamin) .... Take 1 by mouth qd 2)  Vitamin E 400 Unit Caps (Vitamin e) .... Take 1 tablet by mouth once a day 3)  Vitamin D 2000 Unit Tabs (Cholecalciferol) .Marland KitchenMarland KitchenMarland Kitchen  6,000 unit daily 4)  Calcium 500 Mg Tabs (Calcium carbonate) .... Take 2 tablet by mouth once a day 5)  Magnesium 500 Mg Tabs (Magnesium) .... As needed 6)  Zantac 75 75 Mg Tabs (Ranitidine hcl) .... Take as needed. 7)  Zithromax Z-pak 250 Mg Tabs (Azithromycin) 8)  Tamiflu 75 Mg Caps (Oseltamivir phosphate) .[name].. 1 by mouth two times a day x 5 days for possible influenza 9)  Promethazine-codeine 6.25-10 Mg/69ml Syrp (Promethazine-codeine) .[name].. 1 tsp q6 as needed cough Prescriptions: PROMETHAZINE-CODEINE 6.25-10 MG/5ML SYRP (PROMETHAZINE-CODEINE) 1 tsp q6 as needed cough  #8 oz x 1   Entered and Authorized by:   Jacques Navy MD   Signed by:   Jacques Navy MD on 10/24/2010   Method used:   Handwritten   RxID:    [id number] TAMIFLU 75 MG CAPS (OSELTAMIVIR PHOSPHATE) 1 by mouth two times a day x 5 days for possible influenza  #10 x 0   Entered and Authorized by:   Jacques Navy MD   Signed by:   Jacques Navy MD on 10/24/2010   Method used:   Electronically to        [id number] (retail)       [address]       [place], [place]  [address]       Ph: [phone]       Fax: [phone]   RxID:   [id number]    Orders Added: 1)  Est. Patient Level III [74259]

## 2010-11-23 ENCOUNTER — Other Ambulatory Visit: Payer: Self-pay | Admitting: Gynecology

## 2010-11-23 DIAGNOSIS — Z1231 Encounter for screening mammogram for malignant neoplasm of breast: Secondary | ICD-10-CM

## 2010-12-23 NOTE — Assessment & Plan Note (Signed)
[facility]                             PRIMARY CARE OFFICE NOTE   NAME:Mcgee, Sharon RACZYNSKI                       MRN:          [id number]  DATE:06/01/2006                            DOB:          07-08-1951    Sharon Mcgee is a pleasant 60 year old woman who presents for followup  evaluation and examination.  She was last seen March 29, 2005.  Her past medical history, family history and social history are well  documented in that note.   INTERVAL HISTORY:  The patient had a good year with no change in her medical  condition.   INTERVAL SOCIAL HISTORY:  The patient was able to go [place] for a family  wedding and to see her mother for two weeks which was a great trip.  She  also had a one-week trip to [place] and was able to visit with her  extended family.  The patient reports things are stable, both at work and at  home although it has been a bad year for gardening.   MEDICATIONS:  Vitamins only.   HEALTH MAINTENANCE:  The patient has been seen by Dr. Teodora Medici in  September 2007 with a normal pelvic and Pap smear.  She did have a normal  mammogram by her report in February 2007.   CHART REVIEW:  The patient had a normal EKG in August 2006. The patient had  a chest x-ray January 15, 2003 which showed no active disease.  The patient had  colonoscopy August 10, 2005 which was a negative study except for internal  and external hemorrhoids and mild diverticulosis.   REVIEW OF SYSTEMS:  Negative for any constitutional, cardiovascular,  respiratory, GI, GU problems.   PHYSICAL EXAMINATION:  VITAL SIGNS:  Temperature 97.6, blood pressure  113/78, pulse 67, weight 186, height 5 feet 7 inches.  GENERAL APPEARANCE: This is a well-developed, well-nourished woman in no  acute distress.  HEENT:  Normocephalic, atraumatic.  Tympanic membranes normal.  Oropharynx  negative.  Dentition in good repair.  No buccal or palate lesions were  noted.   Posterior pharynx was clear.  Conjunctivae and sclerae clear.  Pupils are equal, round and reactive to light and accommodation.  Funduscopic exam deferred to ophthalmology.  NECK:  Supple without thyromegaly.  NODES:  No adenopathy was noted in the cervical or supraclavicular regions.  CHEST:  No CVA tenderness.  Lungs were clear to auscultation and percussion.  BREASTS:  Deferred to gynecology.  CARDIOVASCULAR:  2+ radial pulses.  No JVD or carotid bruits.  Quiet  precordium is quiet with regular rate and rhythm without murmurs, rubs or  gallops.  ABDOMEN:  Soft. No guarding or rebound.  No organomegaly or splenomegaly was  appreciated.  PELVIC/RECTAL:  Deferred to gynecology,.  EXTREMITIES:  Without clubbing, cyanosis or edema or deformity.  NEUROLOGIC:  Nonfocal.   DATA REVIEWED:  Hemoglobin 13.9, white count 5800 with differential showing  6.7% eosinophils.  Cholesterol 212, triglycerides 88, HDL 63.4, LDL 112.7.  Chemistries were unremarkable.  Serum glucose 95, creatinine 1, glomerular  filtration rate is 62  mL per minute.  Liver functions were normal.  TSH was  normal at 3.01.  Urinalysis was negative.   ASSESSMENT/PLAN:  This is a healthy woman with no abnormal findings on  examination.  Her lipid profile is excellent.  The patient grows her own  vegetables. She uses olive oil including homemade olive oil from [place].  She gets active exercise. She has plenty of sun exposure.  We discussed a  DEXA scan and given her frame size, level of activity and well-balanced  diet, I did not feel that this was required nor was a vitamin D level  required.   The patient is asked to return to see me in one year.  At her request we  will remember to add a vitamin D level to her routine labs.  She will return  to see me otherwise on a p.r.n. basis.    ______________________________  Rosalyn Gess Norins, MD    MEN/MedQ  DD: 06/01/2006  DT: 06/03/2006  Job #: [id number]   cc:   Leatha Gilding.  Mezer, M.D.  Sharon Mcgee

## 2011-01-05 ENCOUNTER — Ambulatory Visit
Admission: RE | Admit: 2011-01-05 | Discharge: 2011-01-05 | Disposition: A | Discharge status: Home / Self Care | Payer: BC Managed Care – PPO | Source: Ambulatory Visit | Attending: Gynecology | Admitting: Gynecology

## 2011-01-05 DIAGNOSIS — Z1231 Encounter for screening mammogram for malignant neoplasm of breast: Secondary | ICD-10-CM

## 2011-04-26 ENCOUNTER — Telehealth: Payer: Self-pay | Admitting: *Deleted

## 2011-04-26 ENCOUNTER — Encounter: Payer: Self-pay | Admitting: Endocrinology

## 2011-04-26 NOTE — Telephone Encounter (Signed)
Spoke w/patient. She c/o lightheadedness off and on x 2 wks. BP this am 111/73 pulse 90. She takes no meds, only otc Vitamins. No fever or ear complaints. She has had some sinus drainage in the last week. Pt also had chest discomfort x 2 different times lasting only a second or two. NO SOB. Advised she call office w/any change in symptoms or go to ER with increase or severe symptoms. Pt agreed and otherwise will keep apt tomorrow for eval.

## 2011-04-27 ENCOUNTER — Ambulatory Visit (INDEPENDENT_AMBULATORY_CARE_PROVIDER_SITE_OTHER): Payer: BC Managed Care – PPO | Admitting: Endocrinology

## 2011-04-27 ENCOUNTER — Encounter: Payer: Self-pay | Admitting: Endocrinology

## 2011-04-27 VITALS — BP 112/82 | HR 62 | Temp 98.2°F | Ht 67.0 in | Wt 194.0 lb

## 2011-04-27 DIAGNOSIS — R51 Headache: Secondary | ICD-10-CM | POA: Insufficient documentation

## 2011-04-27 DIAGNOSIS — R42 Dizziness and giddiness: Secondary | ICD-10-CM

## 2011-04-27 DIAGNOSIS — R519 Headache, unspecified: Secondary | ICD-10-CM | POA: Insufficient documentation

## 2011-04-27 MED ORDER — CEFUROXIME AXETIL 500 MG PO TABS: 500.0000 mg | ORAL_TABLET | Freq: Two times a day (BID) | ORAL | Status: AC

## 2011-04-27 NOTE — Patient Instructions (Addendum)
i have sent a prescription to your pharmacy, for an antibiotic. I hope you feel better soon.  If you don't feel better by next week, please call doctor norins.

## 2011-04-27 NOTE — Progress Notes (Signed)
Subjective:    Patient ID: Sharon Mcgee, female    DOB: 1950-10-07, 60 y.o.   MRN: [id number]  HPI Pt states 10 days of intermittent moderate dizziness sensation in the head. It happens approx once daily, and lasts a few seconds at a time, but yesterday, it lasted a few hrs.  On this occasion, she has assoc nausea.  She has some nasal congestion. Past Medical History  Diagnosis Date  . Osteopenia   . Tobacco use disorder   . Hearing loss   . Abdominal pain, RUQ     Past Surgical History  Procedure Date  . Laproscopy     fertility work up    History   Social History  . Marital Status: Married    Spouse Name: N/A    Number of Children: N/A  . Years of Education: N/A   Occupational History  . Not on file.   Social History Main Topics  . Smoking status: Never Smoker   . Smokeless tobacco: Not on file  . Alcohol Use: Not on file  . Drug Use: Not on file  . Sexually Active: Not on file   Other Topics Concern  . Not on file   Social History Narrative   College Grad in BiologyWorks- Dental OfficeMarried [phone] are going well    Current Outpatient Prescriptions on File Prior to Visit  Medication Sig Dispense Refill  . Calcium Carbonate-Vitamin D (CALCIUM 500 + D PO) Take by mouth.        . magnesium gluconate (MAGONATE) 500 MG tablet Take 500 mg by mouth 2 (two) times daily.        . MULTIPLE VITAMIN PO Take 1 tablet by mouth.        . ranitidine (ZANTAC) 75 MG tablet Take 75 mg by mouth 2 (two) times daily.          No Known Allergies  Family History  Problem Relation Age of Onset  . Melanoma Father     BP 112/82  Pulse 62  Temp(Src) 98.2 F (36.8 C) (Oral)  Ht 5\' 7"  (1.702 m)  Wt 194 lb (87.998 kg)  BMI 30.38 kg/m2  SpO2 99%    Review of Systems  Constitutional: Negative for fever.  HENT: Positive for congestion. Negative for hearing loss.   Eyes: Negative for visual disturbance.  Respiratory:       Slight dry cough  Cardiovascular: Negative  for chest pain.  Gastrointestinal: Negative for anal bleeding.  Genitourinary: Negative for hematuria.  Skin: Negative for rash.  Neurological: Negative for syncope.       She had slight occipital headache on 1 occasion       Objective:   Physical Exam VS: see vs page GEN: no distress HEAD: head: no deformity eyes: no periorbital swelling, no proptosis external nose and ears are normal mouth: no lesion seen Both eac's and tm's are normal. NECK: supple, thyroid is not enlarged CHEST WALL: no deformity LUNGS:  Clear to auscultation CV: reg rate and rhythm, no murmur MUSCULOSKELETAL: muscle bulk and strength are grossly normal.  no obvious joint swelling.  gait is normal and steady EXTEMITIES: no deformity.  no ulcer on the feet.  feet are of normal color and temp.  no edema PULSES: dorsalis pedis intact bilat.  no carotid bruit NEURO:  cn 2-12 grossly intact.   readily moves all 4's.  sensation is intact to touch on the feet SKIN:  Normal texture and temperature.   NODES:  None  palpable at the neck PSYCH: alert, oriented x3.  Does not appear anxious nor depressed.     i reviewed electrocardiogram (no change since 2/12)    Assessment & Plan:  Dizziness, uncertain etiology.  New Poss uri--? Cause of sxs

## 2011-10-16 ENCOUNTER — Ambulatory Visit (INDEPENDENT_AMBULATORY_CARE_PROVIDER_SITE_OTHER): Payer: BC Managed Care – PPO | Admitting: Internal Medicine

## 2011-10-16 ENCOUNTER — Encounter: Payer: Self-pay | Admitting: Internal Medicine

## 2011-10-16 VITALS — BP 98/62 | HR 64 | Temp 97.2°F | Resp 14 | Ht 66.25 in | Wt 192.2 lb

## 2011-10-16 DIAGNOSIS — F17200 Nicotine dependence, unspecified, uncomplicated: Secondary | ICD-10-CM

## 2011-10-16 DIAGNOSIS — E559 Vitamin D deficiency, unspecified: Secondary | ICD-10-CM

## 2011-10-16 DIAGNOSIS — Z0000 Encounter for general adult medical examination without abnormal findings: Secondary | ICD-10-CM | POA: Insufficient documentation

## 2011-10-16 DIAGNOSIS — K219 Gastro-esophageal reflux disease without esophagitis: Secondary | ICD-10-CM

## 2011-10-16 MED ORDER — HYDROCORTISONE 2.5 % RE CREA: TOPICAL_CREAM | Freq: Two times a day (BID) | RECTAL | Status: DC

## 2011-10-16 NOTE — Progress Notes (Signed)
Subjective:    Patient ID: Sharon Mcgee, female    DOB: 09-04-1950, 61 y.o.   MRN: [id number]  HPI Sharon Mcgee presents for follow-up exam and annual visit. She has had a good year: no major illness, no surgery and no injuries. She has an appointment next week with Gyn. Due for mammogram in May.   Past Medical History  Diagnosis Date  . Osteopenia   . Tobacco use disorder   . Hearing loss   . Abdominal pain, RUQ    Past Surgical History  Procedure Date  . Laproscopy     fertility work up  . Uterine polypectomy    Family History  Problem Relation Age of Onset  . Melanoma Father    History   Social History  . Marital Status: Married    Spouse Name: N/A    Number of Children: N/A  . Years of Education: N/A   Occupational History  . Not on file.   Social History Main Topics  . Smoking status: Never Smoker   . Smokeless tobacco: Never Used  . Alcohol Use: Yes  . Drug Use: No  . Sexually Active: Yes -- Female partner(s)   Other Topics Concern  . Not on file   Social History Narrative   HSG. College Grad in Building surveyor. Married '89. Works- Theme park manager. Avid gardner. Travels to [place] to see her mother every year.Things are going well       Review of Systems Constitutional:  Negative for fever, chills, activity change and unexpected weight change.  HEENT:  Negative for hearing loss, ear pain, congestion, neck stiffness and postnasal drip. Negative for sore throat or swallowing problems. Negative for dental complaints.   Eyes: Negative for vision loss or change in visual acuity.  Respiratory: Negative for chest tightness and wheezing. Negative for DOE.   Cardiovascular: Negative for chest pain or palpitations. No decreased exercise tolerance Gastrointestinal: No change in bowel habit. No bloating or gas. No reflux or indigestion. Mild flare of hemorrhoids Genitourinary: Negative for urgency, frequency, flank pain and difficulty urinating.  Musculoskeletal: Negative for  myalgias, back pain, arthralgias and gait problem.  Neurological: Negative for dizziness, tremors, weakness and headaches.  Hematological: Negative for adenopathy.  Psychiatric/Behavioral: Negative for behavioral problems and dysphoric mood.       Objective:   Physical Exam Filed Vitals:   10/16/11 1348  BP: 98/62  Pulse: 64  Temp: 97.2 F (36.2 C)  Resp: 14   Wt Readings from Last 3 Encounters:  10/16/11 192 lb 4 oz (87.204 kg)  04/27/11 194 lb (87.998 kg)  10/24/10 192 lb (87.091 kg)    Gen'l: well nourished, well developed white woman in no distress HEENT - [place]/AT, EACs/TMs normal, oropharynx with native dentition in good condition, no buccal or palatal lesions, posterior pharynx clear, mucous membranes moist. C&S clear, PERRLA, fundi - normal Neck - supple, no thyromegaly Nodes- negative submental, cervical, supraclavicular regions Chest - no deformity, no CVAT Lungs - cleat without rales, wheezes. No increased work of breathing Breast - deferred to gyn and mammography Cardiovascular - regular rate and rhythm, quiet precordium, no murmurs, rubs or gallops, 2+ radial, DP and PT pulses Abdomen - BS+ x 4, no HSM, no guarding or rebound or tenderness Pelvic - deferred to gyn Rectal - deferred to gyn Extremities - no clubbing, cyanosis, edema or deformity.  Neuro - A&O x 3, CN II-XII normal, motor strength normal and equal, DTRs 2+ and symmetrical biceps, radial, and patellar tendons.  Cerebellar - no tremor, no rigidity, fluid movement and normal gait. Derm - Head, neck, back, abdomen and extremities without suspicious lesions  Lab Results  Component Value Date   WBC 5.7 10/18/2011   HGB 13.6 10/18/2011   HCT 40.6 10/18/2011   PLT 244.0 10/18/2011   GLUCOSE 92 10/18/2011   CHOL 166 10/18/2011   TRIG 105.0 10/18/2011   HDL 51.20 10/18/2011   LDLDIRECT 129.4 09/22/2010   LDLCALC 94 10/18/2011        ALT 35 10/18/2011   AST 24 10/18/2011        NA 142 10/18/2011   K 4.4 10/18/2011    CL 108 10/18/2011   CREATININE 0.9 10/18/2011   BUN 16 10/18/2011   CO2 28 10/18/2011   TSH 1.83 10/18/2011          Assessment & Plan:

## 2011-10-16 NOTE — Assessment & Plan Note (Signed)
Interval history is unremarkable except for minor hemorrhoidal itch. Anusol HC 2.5% Rx. Physical exam, sans breast and pelvic, normal. She is current with gyn with appointment soon. Mammogram current. Current for colorectal cancer screening. Immunization is up to date.   In summary - a delightful woman who appears to be in robust help. She will return in 1 year or sooner as needed.

## 2011-10-16 NOTE — Assessment & Plan Note (Signed)
Encouraged to not smoke.

## 2011-10-16 NOTE — Assessment & Plan Note (Signed)
Vit D 25 hydrox  2010 32; 2011 30.   Plan - repeat Vit D level           Take Vit D 1,000 IU daily

## 2011-10-17 NOTE — Assessment & Plan Note (Signed)
Stable and doing well. 

## 2011-10-18 ENCOUNTER — Other Ambulatory Visit (INDEPENDENT_AMBULATORY_CARE_PROVIDER_SITE_OTHER): Payer: BC Managed Care – PPO

## 2011-10-18 DIAGNOSIS — Z0000 Encounter for general adult medical examination without abnormal findings: Secondary | ICD-10-CM

## 2011-10-18 LAB — CBC WITH DIFFERENTIAL/PLATELET
Basophils Absolute: 0 10*3/uL (ref 0.0–0.1)
Basophils Relative: 0.7 % (ref 0.0–3.0)
Eosinophils Absolute: 0.3 10*3/uL (ref 0.0–0.7)
HCT: 40.6 % (ref 36.0–46.0)
Hemoglobin: 13.6 g/dL (ref 12.0–15.0)
Lymphs Abs: 2 10*3/uL (ref 0.7–4.0)
MCHC: 33.5 g/dL (ref 30.0–36.0)
Neutro Abs: 2.8 10*3/uL (ref 1.4–7.7)
RBC: 4.39 Mil/uL (ref 3.87–5.11)
RDW: 12.4 % (ref 11.5–14.6)

## 2011-10-18 LAB — COMPREHENSIVE METABOLIC PANEL
ALT: 35 U/L (ref 0–35)
Albumin: 4.1 g/dL (ref 3.5–5.2)
Alkaline Phosphatase: 53 U/L (ref 39–117)
CO2: 28 mEq/L (ref 19–32)
Glucose, Bld: 92 mg/dL (ref 70–99)
Potassium: 4.4 mEq/L (ref 3.5–5.1)
Sodium: 142 mEq/L (ref 135–145)
Total Bilirubin: 0.3 mg/dL (ref 0.3–1.2)
Total Protein: 6.9 g/dL (ref 6.0–8.3)

## 2011-10-18 LAB — LIPID PANEL
HDL: 51.2 mg/dL (ref >39.00)
Total CHOL/HDL Ratio: 3
VLDL: 21 mg/dL (ref 0.0–40.0)

## 2011-10-18 LAB — HEPATIC FUNCTION PANEL
AST: 24 U/L (ref 0–37)
Albumin: 4.1 g/dL (ref 3.5–5.2)
Alkaline Phosphatase: 53 U/L (ref 39–117)
Total Protein: 6.9 g/dL (ref 6.0–8.3)

## 2011-10-18 LAB — TSH: TSH: 1.83 u[IU]/mL (ref 0.35–5.50)

## 2011-10-19 LAB — VITAMIN D 25 HYDROXY (VIT D DEFICIENCY, FRACTURES): Vit D, 25-Hydroxy: 44 ng/mL (ref 30–89)

## 2011-12-05 ENCOUNTER — Encounter: Payer: BC Managed Care – PPO | Admitting: Internal Medicine

## 2011-12-19 ENCOUNTER — Other Ambulatory Visit: Payer: Self-pay | Admitting: Gynecology

## 2011-12-19 DIAGNOSIS — Z1231 Encounter for screening mammogram for malignant neoplasm of breast: Secondary | ICD-10-CM

## 2012-01-16 ENCOUNTER — Ambulatory Visit
Admission: RE | Admit: 2012-01-16 | Discharge: 2012-01-16 | Disposition: A | Discharge status: Home / Self Care | Payer: BC Managed Care – PPO | Source: Ambulatory Visit | Attending: Gynecology | Admitting: Gynecology

## 2012-01-16 DIAGNOSIS — Z1231 Encounter for screening mammogram for malignant neoplasm of breast: Secondary | ICD-10-CM

## 2012-01-22 ENCOUNTER — Other Ambulatory Visit: Payer: Self-pay | Admitting: Gynecology

## 2012-01-22 DIAGNOSIS — R928 Other abnormal and inconclusive findings on diagnostic imaging of breast: Secondary | ICD-10-CM

## 2012-01-29 ENCOUNTER — Ambulatory Visit
Admission: RE | Admit: 2012-01-29 | Discharge: 2012-01-29 | Disposition: A | Discharge status: Home / Self Care | Payer: BC Managed Care – PPO | Source: Ambulatory Visit | Attending: Gynecology | Admitting: Gynecology

## 2012-01-29 DIAGNOSIS — R928 Other abnormal and inconclusive findings on diagnostic imaging of breast: Secondary | ICD-10-CM

## 2012-04-19 ENCOUNTER — Encounter: Payer: Self-pay | Admitting: Gastroenterology

## 2012-06-04 ENCOUNTER — Encounter: Payer: Self-pay | Admitting: Gastroenterology

## 2012-06-10 ENCOUNTER — Telehealth: Payer: Self-pay | Admitting: *Deleted

## 2012-06-10 NOTE — Telephone Encounter (Signed)
Patient denies any GI complaints. Per Dr.Jacobs recall assessment sheet in chart recall due 08/2015. Notified patient of this, cancelled current appointments and placed patient in our recall system. Patient understands.

## 2012-06-21 ENCOUNTER — Other Ambulatory Visit: Payer: BC Managed Care – PPO | Admitting: Gastroenterology

## 2012-11-28 ENCOUNTER — Other Ambulatory Visit: Payer: Self-pay

## 2012-11-28 DIAGNOSIS — Z1231 Encounter for screening mammogram for malignant neoplasm of breast: Secondary | ICD-10-CM

## 2013-01-13 ENCOUNTER — Ambulatory Visit
Admission: RE | Admit: 2013-01-13 | Discharge: 2013-01-13 | Disposition: A | Discharge status: Home / Self Care | Payer: BC Managed Care – PPO | Source: Ambulatory Visit

## 2013-01-13 DIAGNOSIS — Z1231 Encounter for screening mammogram for malignant neoplasm of breast: Secondary | ICD-10-CM

## 2013-06-19 ENCOUNTER — Other Ambulatory Visit (INDEPENDENT_AMBULATORY_CARE_PROVIDER_SITE_OTHER): Payer: BC Managed Care – PPO

## 2013-06-19 ENCOUNTER — Encounter: Payer: Self-pay | Admitting: Internal Medicine

## 2013-06-19 ENCOUNTER — Ambulatory Visit (INDEPENDENT_AMBULATORY_CARE_PROVIDER_SITE_OTHER): Payer: BC Managed Care – PPO | Admitting: Internal Medicine

## 2013-06-19 VITALS — BP 110/82 | HR 60 | Temp 97.5°F | Ht 66.5 in | Wt 198.4 lb

## 2013-06-19 DIAGNOSIS — Z0000 Encounter for general adult medical examination without abnormal findings: Secondary | ICD-10-CM

## 2013-06-19 DIAGNOSIS — F17200 Nicotine dependence, unspecified, uncomplicated: Secondary | ICD-10-CM

## 2013-06-19 DIAGNOSIS — M899 Disorder of bone, unspecified: Secondary | ICD-10-CM

## 2013-06-19 DIAGNOSIS — H9190 Unspecified hearing loss, unspecified ear: Secondary | ICD-10-CM

## 2013-06-19 LAB — LIPID PANEL
Cholesterol: 228 mg/dL — ABNORMAL HIGH (ref 0–200)
HDL: 84 mg/dL (ref >39.00)
Total CHOL/HDL Ratio: 3
Triglycerides: 46 mg/dL (ref 0.0–149.0)
VLDL: 9.2 mg/dL (ref 0.0–40.0)

## 2013-06-19 LAB — HEPATIC FUNCTION PANEL
ALT: 64 U/L — ABNORMAL HIGH (ref 0–35)
AST: 42 U/L — ABNORMAL HIGH (ref 0–37)
Alkaline Phosphatase: 64 U/L (ref 39–117)
Bilirubin, Direct: 0.1 mg/dL (ref 0.0–0.3)
Total Bilirubin: 0.5 mg/dL (ref 0.3–1.2)
Total Protein: 7.3 g/dL (ref 6.0–8.3)

## 2013-06-19 LAB — COMPREHENSIVE METABOLIC PANEL
Alkaline Phosphatase: 64 U/L (ref 39–117)
BUN: 13 mg/dL (ref 6–23)
CO2: 29 mEq/L (ref 19–32)
Creatinine, Ser: 0.8 mg/dL (ref 0.4–1.2)
GFR: 77.64 mL/min (ref >60.00)
Glucose, Bld: 97 mg/dL (ref 70–99)
Total Bilirubin: 0.5 mg/dL (ref 0.3–1.2)
Total Protein: 7.3 g/dL (ref 6.0–8.3)

## 2013-06-19 LAB — CBC WITH DIFFERENTIAL/PLATELET
Basophils Absolute: 0 10*3/uL (ref 0.0–0.1)
Eosinophils Absolute: 0.2 10*3/uL (ref 0.0–0.7)
Hemoglobin: 13.6 g/dL (ref 12.0–15.0)
Lymphocytes Relative: 37.3 % (ref 12.0–46.0)
MCHC: 34.5 g/dL (ref 30.0–36.0)
Neutro Abs: 3 10*3/uL (ref 1.4–7.7)
Platelets: 253 10*3/uL (ref 150.0–400.0)
RDW: 13.1 % (ref 11.5–14.6)

## 2013-06-19 LAB — LDL CHOLESTEROL, DIRECT: Direct LDL: 129.9 mg/dL

## 2013-06-19 NOTE — Patient Instructions (Signed)
Good to see you. It seems you have had a good year.  Please take the Vitamin D every day.   Please make time to exercise at least 3 times a week.  For routine lab today and results will be posted to MyChart  Check you insurance coverage for Shingles vaccine: if covered make a nurse visit for the shot.  Stay well and have a good winter.

## 2013-06-19 NOTE — Progress Notes (Signed)
Subjective:    Patient ID: Sharon Mcgee, female    DOB: November 20, 1950, 62 y.o.   MRN: [id number]  HPI Sharon Mcgee presents for an annual exam. She has been doing well. In the interval she has been well with no major illness or injury. She has seen Dr. Chevis Pretty in August with a normal exam and had mammogram in June. She reports having colonoscopy in 2007 - no path report, therefore due in 2017. She has had some problem with anal pruritis due to hemorrhoids.  She is current with dental care. She has had an eye exam in the last 12 months. Healthy diet.  Not making time for exercise - it is on the list.   Past Medical History  Diagnosis Date  . Osteopenia   . Tobacco use disorder   . Hearing loss   . Abdominal pain, RUQ    Past Surgical History  Procedure Laterality Date  . Laproscopy      fertility work up  . Uterine polypectomy     Family History  Problem Relation Age of Onset  . Melanoma Father    History   Social History  . Marital Status: Married    Spouse Name: N/A    Number of Children: N/A  . Years of Education: N/A   Occupational History  . Not on file.   Social History Main Topics  . Smoking status: Never Smoker   . Smokeless tobacco: Never Used  . Alcohol Use: Yes  . Drug Use: No  . Sexual Activity: Yes    Partners: Male   Other Topics Concern  . Not on file   Social History Narrative   [employer]. College Grad in Building surveyor. Married '89. Works- Theme park manager. Avid gardner. Travels to [place] to see her mother every year.Things are going well    Current Outpatient Prescriptions on File Prior to Visit  Medication Sig Dispense Refill  . magnesium gluconate (MAGONATE) 500 MG tablet Take 500 mg by mouth 2 (two) times daily.        . ranitidine (ZANTAC) 75 MG tablet Take 75 mg by mouth 2 (two) times daily as needed.        No current facility-administered medications on file prior to visit.      Review of Systems Constitutional:  Negative for fever, chills, activity  change and unexpected weight change.  HEENT:  Positive for hearing loss 70% left ear. no ear pain, congestion, neck stiffness and postnasal drip. Negative for sore throat or swallowing problems. Negative for dental complaints.   Eyes: Negative for vision loss or change in visual acuity.  Respiratory: Negative for chest tightness and wheezing. Negative for DOE.   Cardiovascular: Negative for chest pain or palpitations. No decreased exercise tolerance Gastrointestinal: No change in bowel habit. No bloating or gas. No reflux or indigestion Genitourinary: Negative for urgency, frequency, flank pain and difficulty urinating.  Musculoskeletal: Negative for myalgias, back pain, arthralgias and gait problem. Some hand stiffness.  Neurological: Negative for dizziness, tremors, weaknes. Occasional headaches- eye strain.  Hematological: Negative for adenopathy.  Psychiatric/Behavioral: Negative for behavioral problems and dysphoric mood.       Objective:   Physical Exam Filed Vitals:   06/19/13 1005  BP: 110/82  Pulse: 60  Temp: 97.5 F (36.4 C)   Wt Readings from Last 3 Encounters:  06/19/13 198 lb 6.4 oz (89.994 kg)  10/16/11 192 lb 4 oz (87.204 kg)  04/27/11 194 lb (87.998 kg)   Gen'l: well nourished, well developed  large framed Woman in no distress HEENT - [place]/AT, EACs/TMs normal, oropharynx with native dentition in good condition, no buccal or palatal lesions, posterior pharynx clear, mucous membranes moist. C&S clear, PERRLA, fundi - normal Neck - supple, no thyromegaly Nodes- negative submental, cervical, supraclavicular regions Chest - no deformity, no CVAT Lungs - clear without rales, wheezes. No increased work of breathing Breast - deferred to gyn Cardiovascular - regular rate and rhythm, quiet precordium, no murmurs, rubs or gallops, 2+ radial, DP and PT pulses Abdomen - BS+ x 4, no HSM, no guarding or rebound or tenderness Pelvic - deferred to gyn Rectal - deferred to  gyn Extremities - no clubbing, cyanosis, edema or deformity.  Neuro - A&O x 3, CN II-XII normal, motor strength normal and equal, DTRs 2+ and symmetrical biceps, radial, and patellar tendons. Cerebellar - no tremor, no rigidity, fluid movement and normal gait. Derm - Head, neck, back, abdomen and extremities without suspicious lesions  Recent Results (from the past 2160 hour(s))  HEPATIC FUNCTION PANEL     Status: Abnormal   Collection Time    06/19/13 11:29 AM      Result Value Range   Total Bilirubin 0.5  0.3 - 1.2 mg/dL   Bilirubin, Direct 0.1  0.0 - 0.3 mg/dL   Alkaline Phosphatase 64  39 - 117 U/L   AST 42 (*) 0 - 37 U/L   ALT 64 (*) 0 - 35 U/L   Total Protein 7.3  6.0 - 8.3 g/dL   Albumin 4.1  3.5 - 5.2 g/dL  TSH     Status: None   Collection Time    06/19/13 11:29 AM      Result Value Range   TSH 1.66  0.35 - 5.50 uIU/mL  COMPREHENSIVE METABOLIC PANEL     Status: Abnormal   Collection Time    06/19/13 11:29 AM      Result Value Range   Sodium 140  135 - 145 mEq/L   Potassium 5.0  3.5 - 5.1 mEq/L   Chloride 105  96 - 112 mEq/L   CO2 29  19 - 32 mEq/L   Glucose, Bld 97  70 - 99 mg/dL   BUN 13  6 - 23 mg/dL   Creatinine, Ser 0.8  0.4 - 1.2 mg/dL   Total Bilirubin 0.5  0.3 - 1.2 mg/dL   Alkaline Phosphatase 64  39 - 117 U/L   AST 42 (*) 0 - 37 U/L   ALT 64 (*) 0 - 35 U/L   Total Protein 7.3  6.0 - 8.3 g/dL   Albumin 4.1  3.5 - 5.2 g/dL   Calcium 9.2  8.4 - 14.7 mg/dL   GFR 82.95  >62.13 mL/min  LIPID PANEL     Status: Abnormal   Collection Time    06/19/13 11:29 AM      Result Value Range   Cholesterol 228 (*) 0 - 200 mg/dL   Comment: ATP III Classification       Desirable:  < 200 mg/dL               Borderline High:  200 - 239 mg/dL          High:  > = 086 mg/dL   Triglycerides 57.8  0.0 - 149.0 mg/dL   Comment: Normal:  <469 mg/dLBorderline High:  150 - 199 mg/dL   HDL 62.95  >28.41 mg/dL   VLDL 9.2  0.0 - 32.4 mg/dL   Total CHOL/HDL Ratio  3     Comment:                 Men          Women1/2 Average Risk     3.4          3.3Average Risk          5.0          4.42X Average Risk          9.6          7.13X Average Risk          15.0          11.0                      CBC WITH DIFFERENTIAL     Status: None   Collection Time    06/19/13 11:29 AM      Result Value Range   WBC 6.1  4.5 - 10.5 K/uL   RBC 4.34  3.87 - 5.11 Mil/uL   Hemoglobin 13.6  12.0 - 15.0 g/dL   HCT 16.1  09.6 - 04.5 %   MCV 90.8  78.0 - 100.0 fl   MCHC 34.5  30.0 - 36.0 g/dL   RDW 40.9  81.1 - 91.4 %   Platelets 253.0  150.0 - 400.0 K/uL   Neutrophils Relative % 49.6  43.0 - 77.0 %   Lymphocytes Relative 37.3  12.0 - 46.0 %   Monocytes Relative 8.5  3.0 - 12.0 %   Eosinophils Relative 4.0  0.0 - 5.0 %   Basophils Relative 0.6  0.0 - 3.0 %   Neutro Abs 3.0  1.4 - 7.7 K/uL   Lymphs Abs 2.3  0.7 - 4.0 K/uL   Monocytes Absolute 0.5  0.1 - 1.0 K/uL   Eosinophils Absolute 0.2  0.0 - 0.7 K/uL   Basophils Absolute 0.0  0.0 - 0.1 K/uL  LDL CHOLESTEROL, DIRECT     Status: None   Collection Time    06/19/13 11:29 AM      Result Value Range   Direct LDL 129.9     Comment: Optimal:  <100 mg/dLNear or Above Optimal:  100-129 mg/dLBorderline High:  130-159 mg/dLHigh:  160-189 mg/dLVery High:  >190 mg/dL           Assessment & Plan:

## 2013-06-19 NOTE — Progress Notes (Signed)
Pre visit review using our clinic review tool, if applicable. No additional management support is needed unless otherwise documented below in the visit note. 

## 2013-06-20 ENCOUNTER — Encounter: Payer: Self-pay | Admitting: Internal Medicine

## 2013-06-21 ENCOUNTER — Encounter: Payer: Self-pay | Admitting: Internal Medicine

## 2013-06-21 NOTE — Assessment & Plan Note (Signed)
Interval history is unremarkable. Physical exam, sans breast and pelvic, is normal. Lab results are in normal range including LDL at goal of 130. She is current with colorectal and breast cancer screening. Immunizations - current with tetanus, declines shingles vaccine, due for pneumonia vaccine at 67.   In summary A very nice woman who is medically stable and doing well.

## 2013-06-21 NOTE — Assessment & Plan Note (Signed)
Low probability of bone loss but she does need DEXA - ordered with results pending. Recommendations to follow.

## 2013-06-21 NOTE — Assessment & Plan Note (Signed)
Former smoker. Will obtain chest x-ray.

## 2013-06-21 NOTE — Assessment & Plan Note (Signed)
No real problems with loss of hearing: she does not wear aide on a regular basis. No need for further testing at this time.

## 2013-06-21 NOTE — Assessment & Plan Note (Signed)
Last Vit D level 2013 - 44 normal range  Plan Routine replacement - 1,000 iu Vit D daily

## 2013-06-26 ENCOUNTER — Ambulatory Visit (INDEPENDENT_AMBULATORY_CARE_PROVIDER_SITE_OTHER)
Admission: RE | Admit: 2013-06-26 | Discharge: 2013-06-26 | Disposition: A | Discharge status: Home / Self Care | Payer: BC Managed Care – PPO | Source: Ambulatory Visit | Attending: Internal Medicine | Admitting: Internal Medicine

## 2013-06-26 DIAGNOSIS — M899 Disorder of bone, unspecified: Secondary | ICD-10-CM

## 2013-06-26 DIAGNOSIS — F17200 Nicotine dependence, unspecified, uncomplicated: Secondary | ICD-10-CM

## 2013-10-30 ENCOUNTER — Ambulatory Visit: Payer: BC Managed Care – PPO

## 2013-10-30 ENCOUNTER — Ambulatory Visit (INDEPENDENT_AMBULATORY_CARE_PROVIDER_SITE_OTHER): Payer: BC Managed Care – PPO | Admitting: Internal Medicine

## 2013-10-30 DIAGNOSIS — Z23 Encounter for immunization: Secondary | ICD-10-CM

## 2013-10-30 MED ORDER — HYDROCORTISONE 2.5 % RE CREA: TOPICAL_CREAM | Freq: Two times a day (BID) | RECTAL | Status: AC

## 2013-12-12 ENCOUNTER — Other Ambulatory Visit: Payer: Self-pay

## 2013-12-12 DIAGNOSIS — Z1231 Encounter for screening mammogram for malignant neoplasm of breast: Secondary | ICD-10-CM

## 2014-01-14 ENCOUNTER — Ambulatory Visit: Payer: BC Managed Care – PPO

## 2014-01-14 ENCOUNTER — Ambulatory Visit
Admission: RE | Admit: 2014-01-14 | Discharge: 2014-01-14 | Disposition: A | Discharge status: Home / Self Care | Payer: BC Managed Care – PPO | Source: Ambulatory Visit

## 2014-01-14 DIAGNOSIS — Z1231 Encounter for screening mammogram for malignant neoplasm of breast: Secondary | ICD-10-CM

## 2014-01-15 ENCOUNTER — Ambulatory Visit: Payer: BC Managed Care – PPO

## 2014-06-11 ENCOUNTER — Other Ambulatory Visit: Payer: Self-pay | Admitting: Gynecology

## 2014-06-12 ENCOUNTER — Ambulatory Visit (INDEPENDENT_AMBULATORY_CARE_PROVIDER_SITE_OTHER): Payer: BC Managed Care – PPO | Admitting: Internal Medicine

## 2014-06-12 ENCOUNTER — Encounter: Payer: Self-pay | Admitting: Internal Medicine

## 2014-06-12 VITALS — BP 118/72 | HR 82 | Temp 98.5°F | Resp 16 | Ht 66.0 in | Wt 201.1 lb

## 2014-06-12 DIAGNOSIS — Z87891 Personal history of nicotine dependence: Secondary | ICD-10-CM

## 2014-06-12 DIAGNOSIS — K219 Gastro-esophageal reflux disease without esophagitis: Secondary | ICD-10-CM

## 2014-06-12 DIAGNOSIS — E559 Vitamin D deficiency, unspecified: Secondary | ICD-10-CM

## 2014-06-12 DIAGNOSIS — M8580 Other specified disorders of bone density and structure, unspecified site: Secondary | ICD-10-CM

## 2014-06-12 DIAGNOSIS — Z0000 Encounter for general adult medical examination without abnormal findings: Secondary | ICD-10-CM

## 2014-06-12 LAB — CYTOLOGY - PAP

## 2014-06-12 NOTE — Progress Notes (Signed)
Pre visit review using our clinic review tool, if applicable. No additional management support is needed unless otherwise documented below in the visit note. 

## 2014-06-12 NOTE — Patient Instructions (Signed)
We will have you come back one morning next week to do your blood work on an empty stomach.  We will mail you the results with my notes on the lab tests.  The bump on the back of your head is not anything to worry about and does not look like cancer.  We will see back in about a year, please feel free to call our office if you have any questions or problems before then.

## 2014-06-12 NOTE — Progress Notes (Signed)
   Subjective:    Patient ID: Sharon Mcgee, female    DOB: 04/17/1951, 63 y.o.   MRN: [id number]  HPI The patient is a 63 year old female who comes in today to establish care. She has past medical history of acid reflux, hearing loss, osteopenia. She continues to exercise and tries to eat healthfully. She does have occasional acid reflux for which she takes Zantac. She does not take this on a daily basis. She denies any chest pain, shortness breath, abdominal pain, constipation, diarrhea. She does have a lump on the back of her head which is nonpainful and she feels has been enlarging in the last 6 months.  Review of Systems  Constitutional: Negative for fever, activity change, appetite change, fatigue and unexpected weight change.  HENT: Negative.   Eyes: Negative.   Respiratory: Negative for cough, chest tightness, shortness of breath and wheezing.   Cardiovascular: Negative for chest pain, palpitations and leg swelling.  Gastrointestinal: Negative for abdominal pain, diarrhea, constipation, blood in stool and abdominal distention.       Occasional acid reflux  Genitourinary: Negative.   Musculoskeletal: Negative.   Skin: Negative.   Neurological: Negative.       Objective:   Physical Exam  Constitutional: She is oriented to person, place, and time. She appears well-developed and well-nourished.  overweight  HENT:  Head: Normocephalic and atraumatic.  Eyes: EOM are normal.  Neck: Normal range of motion. Neck supple.  Cardiovascular: Normal rate and regular rhythm.   Pulmonary/Chest: Effort normal and breath sounds normal. No respiratory distress. She has no wheezes.  Abdominal: Soft. Bowel sounds are normal.  Neurological: She is alert and oriented to person, place, and time. Coordination normal.  Skin: Skin is warm and dry.  Hard 0.5 cm lesion on the back of skull right below the ear line, noncancerous appearing, no dark colorations.   Filed Vitals:   06/12/14 1300  BP:  118/72  Pulse: 82  Temp: 98.5 F (36.9 C)  TempSrc: Oral  Resp: 16  Height: 5\' 6"  (1.676 m)  Weight: 201 lb 1.9 oz (91.227 kg)  SpO2: 97%      Assessment & Plan:

## 2014-06-15 NOTE — Assessment & Plan Note (Signed)
Patient continues to be smoke-free. Congratulated her on the success.

## 2014-06-15 NOTE — Assessment & Plan Note (Signed)
Last level normal repeat today. She continues to take vitamin D supplementation.

## 2014-06-15 NOTE — Assessment & Plan Note (Signed)
Spoke with patient about repeating DEXA scan next year to follow-up on her bone health.

## 2014-06-15 NOTE — Assessment & Plan Note (Signed)
Patient completed shingles shot, up-to-date on tetanus, flu. She has last mammogram June of this year which was normal. Colonoscopy up-to-date.check lipid panel and basic metabolic panel at today's visit.

## 2014-06-15 NOTE — Assessment & Plan Note (Signed)
Uses occasional Zantac but not a consistent problem.

## 2014-06-25 ENCOUNTER — Other Ambulatory Visit (INDEPENDENT_AMBULATORY_CARE_PROVIDER_SITE_OTHER): Payer: BC Managed Care – PPO

## 2014-06-25 DIAGNOSIS — Z0000 Encounter for general adult medical examination without abnormal findings: Secondary | ICD-10-CM

## 2014-06-25 LAB — BASIC METABOLIC PANEL
BUN: 15 mg/dL (ref 6–23)
CHLORIDE: 107 meq/L (ref 96–112)
CO2: 26 mEq/L (ref 19–32)
Calcium: 8.9 mg/dL (ref 8.4–10.5)
Creatinine, Ser: 0.9 mg/dL (ref 0.4–1.2)
GFR: 72.15 mL/min (ref >60.00)
Glucose, Bld: 69 mg/dL — ABNORMAL LOW (ref 70–99)
POTASSIUM: 4.4 meq/L (ref 3.5–5.1)
SODIUM: 142 meq/L (ref 135–145)

## 2014-06-25 LAB — LIPID PANEL
CHOLESTEROL: 212 mg/dL — AB (ref 0–200)
HDL: 69.4 mg/dL (ref >39.00)
LDL CALC: 108 mg/dL — AB (ref 0–99)
NonHDL: 142.6
Total CHOL/HDL Ratio: 3
Triglycerides: 172 mg/dL — ABNORMAL HIGH (ref 0.0–149.0)
VLDL: 34.4 mg/dL (ref 0.0–40.0)

## 2014-06-25 LAB — VITAMIN D 25 HYDROXY (VIT D DEFICIENCY, FRACTURES): VITD: 31.43 ng/mL (ref 30.00–100.00)

## 2014-12-17 ENCOUNTER — Other Ambulatory Visit: Payer: Self-pay

## 2014-12-17 DIAGNOSIS — Z1231 Encounter for screening mammogram for malignant neoplasm of breast: Secondary | ICD-10-CM

## 2015-01-21 ENCOUNTER — Ambulatory Visit
Admission: RE | Admit: 2015-01-21 | Discharge: 2015-01-21 | Disposition: A | Discharge status: Home / Self Care | Payer: No Typology Code available for payment source | Source: Ambulatory Visit

## 2015-01-21 DIAGNOSIS — Z1231 Encounter for screening mammogram for malignant neoplasm of breast: Secondary | ICD-10-CM

## 2015-07-22 ENCOUNTER — Encounter: Payer: Self-pay | Admitting: Internal Medicine

## 2015-07-22 ENCOUNTER — Ambulatory Visit (INDEPENDENT_AMBULATORY_CARE_PROVIDER_SITE_OTHER): Payer: No Typology Code available for payment source | Admitting: Internal Medicine

## 2015-07-22 VITALS — BP 134/84 | HR 79 | Temp 98.1°F | Resp 14 | Ht 66.0 in | Wt 202.0 lb

## 2015-07-22 DIAGNOSIS — Z0000 Encounter for general adult medical examination without abnormal findings: Secondary | ICD-10-CM | POA: Diagnosis not present

## 2015-07-22 MED ORDER — TRIAMCINOLONE ACETONIDE 0.1 % EX CREA: 1.0000 "application " | TOPICAL_CREAM | Freq: Two times a day (BID) | CUTANEOUS | Status: DC

## 2015-07-22 NOTE — Patient Instructions (Signed)
We will check the lab results today and mail you a copy of the results.   You are doing well so work on exercising 4-5 times per week to help with losing weight.   Come back next year or sooner if you have any problems or questions.   I hope you have a merry Christmas!  Health Maintenance, Female Adopting a healthy lifestyle and getting preventive care can go a long way to promote health and wellness. Talk with your health care provider about what schedule of regular examinations is right for you. This is a good chance for you to check in with your provider about disease prevention and staying healthy. In between checkups, there are plenty of things you can do on your own. Experts have done a lot of research about which lifestyle changes and preventive measures are most likely to keep you healthy. Ask your health care provider for more information. WEIGHT AND DIET  Eat a healthy diet  Be sure to include plenty of vegetables, fruits, low-fat dairy products, and lean protein.  Do not eat a lot of foods high in solid fats, added sugars, or salt.  Get regular exercise. This is one of the most important things you can do for your health.  Most adults should exercise for at least 150 minutes each week. The exercise should increase your heart rate and make you sweat (moderate-intensity exercise).  Most adults should also do strengthening exercises at least twice a week. This is in addition to the moderate-intensity exercise.  Maintain a healthy weight  Body mass index (BMI) is a measurement that can be used to identify possible weight problems. It estimates body fat based on height and weight. Your health care provider can help determine your BMI and help you achieve or maintain a healthy weight.  For females 64 years of age and older:   A BMI below 18.5 is considered underweight.  A BMI of 18.5 to 24.9 is normal.  A BMI of 25 to 29.9 is considered overweight.  A BMI of 30 and above is  considered obese.  Watch levels of cholesterol and blood lipids  You should start having your blood tested for lipids and cholesterol at 62 years of age, then have this test every 5 years.  You may need to have your cholesterol levels checked more often if:  Your lipid or cholesterol levels are high.  You are older than 62 years of age.  You are at high risk for heart disease.  CANCER SCREENING   Lung Cancer  Lung cancer screening is recommended for adults 55-38 years old who are at high risk for lung cancer because of a history of smoking.  A yearly low-dose CT scan of the lungs is recommended for people who:  Currently smoke.  Have quit within the past 15 years.  Have at least a 30-pack-year history of smoking. A pack year is smoking an average of one pack of cigarettes a day for 1 year.  Yearly screening should continue until it has been 15 years since you quit.  Yearly screening should stop if you develop a health problem that would prevent you from having lung cancer treatment.  Breast Cancer  Practice breast self-awareness. This means understanding how your breasts normally appear and feel.  It also means doing regular breast self-exams. Let your health care provider know about any changes, no matter how small.  If you are in your 20s or 30s, you should have a clinical breast exam (  CBE) by a health care provider every 1-3 years as part of a regular health exam.  If you are 61 or older, have a CBE every year. Also consider having a breast X-ray (mammogram) every year.  If you have a family history of breast cancer, talk to your health care provider about genetic screening.  If you are at high risk for breast cancer, talk to your health care provider about having an MRI and a mammogram every year.  Breast cancer gene (BRCA) assessment is recommended for women who have family members with BRCA-related cancers. BRCA-related cancers  include:  Breast.  Ovarian.  Tubal.  Peritoneal cancers.  Results of the assessment will determine the need for genetic counseling and BRCA1 and BRCA2 testing. Cervical Cancer Your health care provider may recommend that you be screened regularly for cancer of the pelvic organs (ovaries, uterus, and vagina). This screening involves a pelvic examination, including checking for microscopic changes to the surface of your cervix (Pap test). You may be encouraged to have this screening done every 3 years, beginning at age 86.  For women ages 65-65, health care providers may recommend pelvic exams and Pap testing every 3 years, or they may recommend the Pap and pelvic exam, combined with testing for human papilloma virus (HPV), every 5 years. Some types of HPV increase your risk of cervical cancer. Testing for HPV may also be done on women of any age with unclear Pap test results.  Other health care providers may not recommend any screening for nonpregnant women who are considered low risk for pelvic cancer and who do not have symptoms. Ask your health care provider if a screening pelvic exam is right for you.  If you have had past treatment for cervical cancer or a condition that could lead to cancer, you need Pap tests and screening for cancer for at least 20 years after your treatment. If Pap tests have been discontinued, your risk factors (such as having a new sexual partner) need to be reassessed to determine if screening should resume. Some women have medical problems that increase the chance of getting cervical cancer. In these cases, your health care provider may recommend more frequent screening and Pap tests. Colorectal Cancer  This type of cancer can be detected and often prevented.  Routine colorectal cancer screening usually begins at 63 years of age and continues through 62 years of age.  Your health care provider may recommend screening at an earlier age if you have risk factors for  colon cancer.  Your health care provider may also recommend using home test kits to check for hidden blood in the stool.  A small camera at the end of a tube can be used to examine your colon directly (sigmoidoscopy or colonoscopy). This is done to check for the earliest forms of colorectal cancer.  Routine screening usually begins at age 29.  Direct examination of the colon should be repeated every 5-10 years through 62 years of age. However, you may need to be screened more often if early forms of precancerous polyps or small growths are found. Skin Cancer  Check your skin from head to toe regularly.  Tell your health care provider about any new moles or changes in moles, especially if there is a change in a mole's shape or color.  Also tell your health care provider if you have a mole that is larger than the size of a pencil eraser.  Always use sunscreen. Apply sunscreen liberally and repeatedly throughout  the day.  Protect yourself by wearing long sleeves, pants, a wide-brimmed hat, and sunglasses whenever you are outside. HEART DISEASE, DIABETES, AND HIGH BLOOD PRESSURE   High blood pressure causes heart disease and increases the risk of stroke. High blood pressure is more likely to develop in:  People who have blood pressure in the high end of the normal range (130-139/85-89 mm Hg).  People who are overweight or obese.  People who are African American.  If you are 18-39 years of age, have your blood pressure checked every 3-5 years. If you are 40 years of age or older, have your blood pressure checked every year. You should have your blood pressure measured twice--once when you are at a hospital or clinic, and once when you are not at a hospital or clinic. Record the average of the two measurements. To check your blood pressure when you are not at a hospital or clinic, you can use:  An automated blood pressure machine at a pharmacy.  A home blood pressure monitor.  If you  are between 55 years and 79 years old, ask your health care provider if you should take aspirin to prevent strokes.  Have regular diabetes screenings. This involves taking a blood sample to check your fasting blood sugar level.  If you are at a normal weight and have a low risk for diabetes, have this test once every three years after 62 years of age.  If you are overweight and have a high risk for diabetes, consider being tested at a younger age or more often. PREVENTING INFECTION  Hepatitis B  If you have a higher risk for hepatitis B, you should be screened for this virus. You are considered at high risk for hepatitis B if:  You were born in a country where hepatitis B is common. Ask your health care provider which countries are considered high risk.  Your parents were born in a high-risk country, and you have not been immunized against hepatitis B (hepatitis B vaccine).  You have HIV or AIDS.  You use needles to inject street drugs.  You live with someone who has hepatitis B.  You have had sex with someone who has hepatitis B.  You get hemodialysis treatment.  You take certain medicines for conditions, including cancer, organ transplantation, and autoimmune conditions. Hepatitis C  Blood testing is recommended for:  Everyone born from 1945 through 1965.  Anyone with known risk factors for hepatitis C. Sexually transmitted infections (STIs)  You should be screened for sexually transmitted infections (STIs) including gonorrhea and chlamydia if:  You are sexually active and are younger than 62 years of age.  You are older than 62 years of age and your health care provider tells you that you are at risk for this type of infection.  Your sexual activity has changed since you were last screened and you are at an increased risk for chlamydia or gonorrhea. Ask your health care provider if you are at risk.  If you do not have HIV, but are at risk, it may be recommended that you  take a prescription medicine daily to prevent HIV infection. This is called pre-exposure prophylaxis (PrEP). You are considered at risk if:  You are sexually active and do not regularly use condoms or know the HIV status of your partner(s).  You take drugs by injection.  You are sexually active with a partner who has HIV. Talk with your health care provider about whether you are at high risk   of being infected with HIV. If you choose to begin PrEP, you should first be tested for HIV. You should then be tested every 3 months for as long as you are taking PrEP.  PREGNANCY   If you are premenopausal and you may become pregnant, ask your health care provider about preconception counseling.  If you may become pregnant, take 400 to 800 micrograms (mcg) of folic acid every day.  If you want to prevent pregnancy, talk to your health care provider about birth control (contraception). OSTEOPOROSIS AND MENOPAUSE   Osteoporosis is a disease in which the bones lose minerals and strength with aging. This can result in serious bone fractures. Your risk for osteoporosis can be identified using a bone density scan.  If you are 65 years of age or older, or if you are at risk for osteoporosis and fractures, ask your health care provider if you should be screened.  Ask your health care provider whether you should take a calcium or vitamin D supplement to lower your risk for osteoporosis.  Menopause may have certain physical symptoms and risks.  Hormone replacement therapy may reduce some of these symptoms and risks. Talk to your health care provider about whether hormone replacement therapy is right for you.  HOME CARE INSTRUCTIONS   Schedule regular health, dental, and eye exams.  Stay current with your immunizations.   Do not use any tobacco products including cigarettes, chewing tobacco, or electronic cigarettes.  If you are pregnant, do not drink alcohol.  If you are breastfeeding, limit how  much and how often you drink alcohol.  Limit alcohol intake to no more than 1 drink per day for nonpregnant women. One drink equals 12 ounces of beer, 5 ounces of wine, or 1 ounces of hard liquor.  Do not use street drugs.  Do not share needles.  Ask your health care provider for help if you need support or information about quitting drugs.  Tell your health care provider if you often feel depressed.  Tell your health care provider if you have ever been abused or do not feel safe at home.   This information is not intended to replace advice given to you by your health care provider. Make sure you discuss any questions you have with your health care provider.   Document Released: 02/06/2011 Document Revised: 08/14/2014 Document Reviewed: 06/25/2013 [employer] Interactive Patient Education 2016 [employer]  

## 2015-07-22 NOTE — Progress Notes (Signed)
   Subjective:    Patient ID: Sharon Mcgee, female    DOB: [birth year], 64 y.o.   MRN: [id number]  HPI The patient is a 64 YO female coming in for wellness. No new concerns. Still not smoking. Not exercising as much lately.  PMH, [facility], social history reviewed and updated.   Review of Systems  Constitutional: Negative for fever, activity change, appetite change, fatigue and unexpected weight change.  HENT: Negative.   Eyes: Negative.   Respiratory: Negative for cough, chest tightness, shortness of breath and wheezing.   Cardiovascular: Negative for chest pain, palpitations and leg swelling.  Gastrointestinal: Negative for abdominal pain, diarrhea, constipation, blood in stool and abdominal distention.       Occasional acid reflux  Genitourinary: Negative.   Musculoskeletal: Negative.   Skin: Negative.   Neurological: Negative.       Objective:   Physical Exam  Constitutional: She is oriented to person, place, and time. She appears well-developed and well-nourished.  overweight  HENT:  Head: Normocephalic and atraumatic.  Eyes: EOM are normal.  Neck: Normal range of motion. Neck supple.  Cardiovascular: Normal rate and regular rhythm.   Pulmonary/Chest: Effort normal and breath sounds normal. No respiratory distress. She has no wheezes.  Abdominal: Soft. Bowel sounds are normal.  Neurological: She is alert and oriented to person, place, and time. Coordination normal.  Skin: Skin is warm and dry.   Filed Vitals:   07/22/15 1338  BP: 134/84  Pulse: 79  Temp: 98.1 F (36.7 C)  TempSrc: Oral  Resp: 14  Height: 5\' 6"  (1.676 m)  Weight: 202 lb (91.627 kg)  SpO2: 98%      Assessment & Plan:

## 2015-07-22 NOTE — Progress Notes (Signed)
Pre visit review using our clinic review tool, if applicable. No additional management support is needed unless otherwise documented below in the visit note. 

## 2015-07-22 NOTE — Assessment & Plan Note (Signed)
Checking labs, flu shot current. Still non-smoker. Counseled about the need for diet and exercise.

## 2015-07-29 ENCOUNTER — Other Ambulatory Visit (INDEPENDENT_AMBULATORY_CARE_PROVIDER_SITE_OTHER): Payer: No Typology Code available for payment source

## 2015-07-29 DIAGNOSIS — Z0000 Encounter for general adult medical examination without abnormal findings: Secondary | ICD-10-CM | POA: Diagnosis not present

## 2015-07-29 LAB — COMPREHENSIVE METABOLIC PANEL
ALBUMIN: 4.1 g/dL (ref 3.5–5.2)
ALK PHOS: 66 U/L (ref 39–117)
ALT: 31 U/L (ref 0–35)
AST: 23 U/L (ref 0–37)
BUN: 15 mg/dL (ref 6–23)
CALCIUM: 9.6 mg/dL (ref 8.4–10.5)
CO2: 28 mEq/L (ref 19–32)
Chloride: 107 mEq/L (ref 96–112)
Creatinine, Ser: 0.83 mg/dL (ref 0.40–1.20)
GFR: 73.89 mL/min (ref >60.00)
Glucose, Bld: 93 mg/dL (ref 70–99)
POTASSIUM: 4.9 meq/L (ref 3.5–5.1)
Sodium: 143 mEq/L (ref 135–145)
TOTAL PROTEIN: 7.1 g/dL (ref 6.0–8.3)
Total Bilirubin: 0.6 mg/dL (ref 0.2–1.2)

## 2015-07-29 LAB — LIPID PANEL
CHOLESTEROL: 218 mg/dL — AB (ref 0–200)
HDL: 70 mg/dL (ref >39.00)
LDL Cholesterol: 112 mg/dL — ABNORMAL HIGH (ref 0–99)
NonHDL: 147.75
TRIGLYCERIDES: 179 mg/dL — AB (ref 0.0–149.0)
Total CHOL/HDL Ratio: 3
VLDL: 35.8 mg/dL (ref 0.0–40.0)

## 2015-07-29 LAB — TSH: TSH: 3.03 u[IU]/mL (ref 0.35–4.50)

## 2015-07-29 LAB — CBC
HEMATOCRIT: 41.6 % (ref 36.0–46.0)
HEMOGLOBIN: 14 g/dL (ref 12.0–15.0)
MCHC: 33.7 g/dL (ref 30.0–36.0)
MCV: 93.4 fl (ref 78.0–100.0)
Platelets: 261 10*3/uL (ref 150.0–400.0)
RBC: 4.45 Mil/uL (ref 3.87–5.11)
RDW: 12.7 % (ref 11.5–15.5)
WBC: 5.6 10*3/uL (ref 4.0–10.5)

## 2015-07-29 LAB — VITAMIN D 25 HYDROXY (VIT D DEFICIENCY, FRACTURES): VITD: 25.85 ng/mL — AB (ref 30.00–100.00)

## 2015-10-28 ENCOUNTER — Encounter: Payer: Self-pay | Admitting: Internal Medicine

## 2016-01-21 ENCOUNTER — Other Ambulatory Visit: Payer: Self-pay | Admitting: Obstetrics and Gynecology

## 2016-01-21 ENCOUNTER — Other Ambulatory Visit: Payer: Self-pay | Admitting: Gynecology

## 2016-01-21 DIAGNOSIS — Z1231 Encounter for screening mammogram for malignant neoplasm of breast: Secondary | ICD-10-CM

## 2016-03-15 ENCOUNTER — Other Ambulatory Visit: Payer: Self-pay | Admitting: *Deleted

## 2016-03-15 MED ORDER — HYDROCORTISONE 2.5 % RE CREA: TOPICAL_CREAM | Freq: Two times a day (BID) | RECTAL | 0 refills | Status: DC | PRN

## 2016-03-15 NOTE — Telephone Encounter (Signed)
Rec'd call pt states she is having a flare up with hemorrhoids wanting to get refill on the hemorrhoid cream that was previous rx by dr. Linda Hedges what she have has expired. Verified pharmacy inform will send to [facility]...Sharon Mcgee

## 2016-03-16 ENCOUNTER — Ambulatory Visit: Payer: No Typology Code available for payment source

## 2016-03-23 ENCOUNTER — Ambulatory Visit: Payer: No Typology Code available for payment source

## 2016-04-06 ENCOUNTER — Ambulatory Visit
Admission: RE | Admit: 2016-04-06 | Discharge: 2016-04-06 | Disposition: A | Discharge status: Home / Self Care | Payer: PRIVATE HEALTH INSURANCE | Source: Ambulatory Visit | Attending: Obstetrics and Gynecology | Admitting: Obstetrics and Gynecology

## 2016-04-06 DIAGNOSIS — Z1231 Encounter for screening mammogram for malignant neoplasm of breast: Secondary | ICD-10-CM

## 2016-07-06 ENCOUNTER — Ambulatory Visit (INDEPENDENT_AMBULATORY_CARE_PROVIDER_SITE_OTHER): Payer: PRIVATE HEALTH INSURANCE | Admitting: Internal Medicine

## 2016-07-06 ENCOUNTER — Encounter: Payer: Self-pay | Admitting: Internal Medicine

## 2016-07-06 VITALS — BP 110/74 | HR 71 | Temp 98.5°F | Resp 18 | Ht 66.5 in | Wt 192.0 lb

## 2016-07-06 DIAGNOSIS — H9192 Unspecified hearing loss, left ear: Secondary | ICD-10-CM

## 2016-07-06 DIAGNOSIS — K219 Gastro-esophageal reflux disease without esophagitis: Secondary | ICD-10-CM

## 2016-07-06 DIAGNOSIS — E559 Vitamin D deficiency, unspecified: Secondary | ICD-10-CM

## 2016-07-06 DIAGNOSIS — Z1159 Encounter for screening for other viral diseases: Secondary | ICD-10-CM | POA: Diagnosis not present

## 2016-07-06 DIAGNOSIS — Z0000 Encounter for general adult medical examination without abnormal findings: Secondary | ICD-10-CM

## 2016-07-06 MED ORDER — HYDROCORTISONE 2.5 % RE CREA: TOPICAL_CREAM | Freq: Two times a day (BID) | RECTAL | 11 refills | Status: DC | PRN

## 2016-07-06 NOTE — Assessment & Plan Note (Signed)
Hearing aid for left ear which she does not use often.

## 2016-07-06 NOTE — Progress Notes (Signed)
Pre visit review using our clinic review tool, if applicable. No additional management support is needed unless otherwise documented below in the visit note. 

## 2016-07-06 NOTE — Assessment & Plan Note (Signed)
Zantac is still doing well and can continue.

## 2016-07-06 NOTE — Assessment & Plan Note (Signed)
Checking vitamin D level, on oral replacement.

## 2016-07-06 NOTE — Progress Notes (Signed)
   Subjective:    Patient ID: Sharon Mcgee, female    DOB: [birth year], 65 y.o.   MRN: [id number]  HPI The patient is a 65 YO female coming in for wellness. No new concerns. Not exercising much.  PMH, [facility], social history reviewed and updated.   Review of Systems  Constitutional: Negative.   HENT: Negative.   Eyes: Negative.   Respiratory: Negative for cough, chest tightness and shortness of breath.   Cardiovascular: Negative for chest pain, palpitations and leg swelling.  Gastrointestinal: Negative for abdominal distention, abdominal pain, constipation, diarrhea, nausea and vomiting.  Musculoskeletal: Negative.   Skin: Negative.   Neurological: Negative.   Psychiatric/Behavioral: Negative.       Objective:   Physical Exam  Constitutional: She is oriented to person, place, and time. She appears well-developed and well-nourished.  HENT:  Head: Normocephalic and atraumatic.  Eyes: EOM are normal.  Neck: Normal range of motion.  Cardiovascular: Normal rate and regular rhythm.   Pulmonary/Chest: Effort normal and breath sounds normal. No respiratory distress. She has no wheezes. She has no rales.  Abdominal: Soft. Bowel sounds are normal. She exhibits no distension. There is no tenderness. There is no rebound.  Musculoskeletal: She exhibits no edema.  Neurological: She is alert and oriented to person, place, and time. Coordination normal.  Skin: Skin is warm and dry.  Psychiatric: She has a normal mood and affect.   Vitals:   07/06/16 1333  BP: 110/74  Pulse: 71  Resp: 18  Temp: 98.5 F (36.9 C)  TempSrc: Oral  SpO2: 98%  Weight: 192 lb (87.1 kg)  Height: 5' 6.5" (1.689 m)      Assessment & Plan:

## 2016-07-06 NOTE — Assessment & Plan Note (Signed)
Needs colonoscopy and will do that after the 1st of the year. Flu shot done already. Tetanus up to date and shingles done. Mammogram and pap smear up to date. Counseled about sun safety and mole surveillance as well as dangers of distracted driving. Given screening recommendations.

## 2016-07-06 NOTE — Patient Instructions (Signed)
We have sent in the refills and will check the labs.   The lab is open at 7:30 am M-F.  Think about doing some exercise regularly to keep the health good.   Health Maintenance, Female Introduction Adopting a healthy lifestyle and getting preventive care can go a long way to promote health and wellness. Talk with your health care provider about what schedule of regular examinations is right for you. This is a good chance for you to check in with your provider about disease prevention and staying healthy. In between checkups, there are plenty of things you can do on your own. Experts have done a lot of research about which lifestyle changes and preventive measures are most likely to keep you healthy. Ask your health care provider for more information. Weight and diet Eat a healthy diet  Be sure to include plenty of vegetables, fruits, low-fat dairy products, and lean protein.  Do not eat a lot of foods high in solid fats, added sugars, or salt.  Get regular exercise. This is one of the most important things you can do for your health.  Most adults should exercise for at least 150 minutes each week. The exercise should increase your heart rate and make you sweat (moderate-intensity exercise).  Most adults should also do strengthening exercises at least twice a week. This is in addition to the moderate-intensity exercise. Maintain a healthy weight  Body mass index (BMI) is a measurement that can be used to identify possible weight problems. It estimates body fat based on height and weight. Your health care provider can help determine your BMI and help you achieve or maintain a healthy weight.  For females 2 years of age and older:  A BMI below 18.5 is considered underweight.  A BMI of 18.5 to 24.9 is normal.  A BMI of 25 to 29.9 is considered overweight.  A BMI of 30 and above is considered obese. Watch levels of cholesterol and blood lipids  You should start having your blood  tested for lipids and cholesterol at 63 years of age, then have this test every 5 years.  You may need to have your cholesterol levels checked more often if:  Your lipid or cholesterol levels are high.  You are older than 63 years of age.  You are at high risk for heart disease. Cancer screening Lung Cancer  Lung cancer screening is recommended for adults 67-64 years old who are at high risk for lung cancer because of a history of smoking.  A yearly low-dose CT scan of the lungs is recommended for people who:  Currently smoke.  Have quit within the past 15 years.  Have at least a 30-pack-year history of smoking. A pack year is smoking an average of one pack of cigarettes a day for 1 year.  Yearly screening should continue until it has been 15 years since you quit.  Yearly screening should stop if you develop a health problem that would prevent you from having lung cancer treatment. Breast Cancer  Practice breast self-awareness. This means understanding how your breasts normally appear and feel.  It also means doing regular breast self-exams. Let your health care provider know about any changes, no matter how small.  If you are in your 20s or 30s, you should have a clinical breast exam (CBE) by a health care provider every 1-3 years as part of a regular health exam.  If you are 44 or older, have a CBE every year. Also consider having  a breast X-ray (mammogram) every year.  If you have a family history of breast cancer, talk to your health care provider about genetic screening.  If you are at high risk for breast cancer, talk to your health care provider about having an MRI and a mammogram every year.  Breast cancer gene (BRCA) assessment is recommended for women who have family members with BRCA-related cancers. BRCA-related cancers include:  Breast.  Ovarian.  Tubal.  Peritoneal cancers.  Results of the assessment will determine the need for genetic counseling and  BRCA1 and BRCA2 testing. Cervical Cancer  Your health care provider may recommend that you be screened regularly for cancer of the pelvic organs (ovaries, uterus, and vagina). This screening involves a pelvic examination, including checking for microscopic changes to the surface of your cervix (Pap test). You may be encouraged to have this screening done every 3 years, beginning at age 61.  For women ages 44-65, health care providers may recommend pelvic exams and Pap testing every 3 years, or they may recommend the Pap and pelvic exam, combined with testing for human papilloma virus (HPV), every 5 years. Some types of HPV increase your risk of cervical cancer. Testing for HPV may also be done on women of any age with unclear Pap test results.  Other health care providers may not recommend any screening for nonpregnant women who are considered low risk for pelvic cancer and who do not have symptoms. Ask your health care provider if a screening pelvic exam is right for you.  If you have had past treatment for cervical cancer or a condition that could lead to cancer, you need Pap tests and screening for cancer for at least 20 years after your treatment. If Pap tests have been discontinued, your risk factors (such as having a new sexual partner) need to be reassessed to determine if screening should resume. Some women have medical problems that increase the chance of getting cervical cancer. In these cases, your health care provider may recommend more frequent screening and Pap tests. Colorectal Cancer  This type of cancer can be detected and often prevented.  Routine colorectal cancer screening usually begins at 63 years of age and continues through 63 years of age.  Your health care provider may recommend screening at an earlier age if you have risk factors for colon cancer.  Your health care provider may also recommend using home test kits to check for hidden blood in the stool.  A small camera at  the end of a tube can be used to examine your colon directly (sigmoidoscopy or colonoscopy). This is done to check for the earliest forms of colorectal cancer.  Routine screening usually begins at age 65.  Direct examination of the colon should be repeated every 5-10 years through 63 years of age. However, you may need to be screened more often if early forms of precancerous polyps or small growths are found. Skin Cancer  Check your skin from head to toe regularly.  Tell your health care provider about any new moles or changes in moles, especially if there is a change in a mole's shape or color.  Also tell your health care provider if you have a mole that is larger than the size of a pencil eraser.  Always use sunscreen. Apply sunscreen liberally and repeatedly throughout the day.  Protect yourself by wearing long sleeves, pants, a wide-brimmed hat, and sunglasses whenever you are outside. Heart disease, diabetes, and high blood pressure  High blood pressure  causes heart disease and increases the risk of stroke. High blood pressure is more likely to develop in:  People who have blood pressure in the high end of the normal range (130-139/85-89 mm Hg).  People who are overweight or obese.  People who are African American.  If you are 75-45 years of age, have your blood pressure checked every 3-5 years. If you are 66 years of age or older, have your blood pressure checked every year. You should have your blood pressure measured twice-once when you are at a hospital or clinic, and once when you are not at a hospital or clinic. Record the average of the two measurements. To check your blood pressure when you are not at a hospital or clinic, you can use:  An automated blood pressure machine at a pharmacy.  A home blood pressure monitor.  If you are between 37 years and 100 years old, ask your health care provider if you should take aspirin to prevent strokes.  Have regular diabetes  screenings. This involves taking a blood sample to check your fasting blood sugar level.  If you are at a normal weight and have a low risk for diabetes, have this test once every three years after 63 years of age.  If you are overweight and have a high risk for diabetes, consider being tested at a younger age or more often. Preventing infection Hepatitis B  If you have a higher risk for hepatitis B, you should be screened for this virus. You are considered at high risk for hepatitis B if:  You were born in a country where hepatitis B is common. Ask your health care provider which countries are considered high risk.  Your parents were born in a high-risk country, and you have not been immunized against hepatitis B (hepatitis B vaccine).  You have HIV or AIDS.  You use needles to inject street drugs.  You live with someone who has hepatitis B.  You have had sex with someone who has hepatitis B.  You get hemodialysis treatment.  You take certain medicines for conditions, including cancer, organ transplantation, and autoimmune conditions. Hepatitis C  Blood testing is recommended for:  Everyone born from [birth year] through 1963.  Anyone with known risk factors for hepatitis C. Sexually transmitted infections (STIs)  You should be screened for sexually transmitted infections (STIs) including gonorrhea and chlamydia if:  You are sexually active and are younger than 63 years of age.  You are older than 63 years of age and your health care provider tells you that you are at risk for this type of infection.  Your sexual activity has changed since you were last screened and you are at an increased risk for chlamydia or gonorrhea. Ask your health care provider if you are at risk.  If you do not have HIV, but are at risk, it may be recommended that you take a prescription medicine daily to prevent HIV infection. This is called pre-exposure prophylaxis (PrEP). You are considered at risk  if:  You are sexually active and do not regularly use condoms or know the HIV status of your partner(s).  You take drugs by injection.  You are sexually active with a partner who has HIV. Talk with your health care provider about whether you are at high risk of being infected with HIV. If you choose to begin PrEP, you should first be tested for HIV. You should then be tested every 3 months for as long as you are  taking PrEP. Pregnancy  If you are premenopausal and you may become pregnant, ask your health care provider about preconception counseling.  If you may become pregnant, take 400 to 800 micrograms (mcg) of folic acid every day.  If you want to prevent pregnancy, talk to your health care provider about birth control (contraception). Osteoporosis and menopause  Osteoporosis is a disease in which the bones lose minerals and strength with aging. This can result in serious bone fractures. Your risk for osteoporosis can be identified using a bone density scan.  If you are 22 years of age or older, or if you are at risk for osteoporosis and fractures, ask your health care provider if you should be screened.  Ask your health care provider whether you should take a calcium or vitamin D supplement to lower your risk for osteoporosis.  Menopause may have certain physical symptoms and risks.  Hormone replacement therapy may reduce some of these symptoms and risks. Talk to your health care provider about whether hormone replacement therapy is right for you. Follow these instructions at home:  Schedule regular health, dental, and eye exams.  Stay current with your immunizations.  Do not use any tobacco products including cigarettes, chewing tobacco, or electronic cigarettes.  If you are pregnant, do not drink alcohol.  If you are breastfeeding, limit how much and how often you drink alcohol.  Limit alcohol intake to no more than 1 drink per day for nonpregnant women. One drink equals  12 ounces of beer, 5 ounces of wine, or 1 ounces of hard liquor.  Do not use street drugs.  Do not share needles.  Ask your health care provider for help if you need support or information about quitting drugs.  Tell your health care provider if you often feel depressed.  Tell your health care provider if you have ever been abused or do not feel safe at home. This information is not intended to replace advice given to you by your health care provider. Make sure you discuss any questions you have with your health care provider. Document Released: 02/06/2011 Document Revised: 12/30/2015 Document Reviewed: 04/27/2015  2017 [employer]vier

## 2016-07-11 ENCOUNTER — Other Ambulatory Visit (INDEPENDENT_AMBULATORY_CARE_PROVIDER_SITE_OTHER): Payer: PRIVATE HEALTH INSURANCE

## 2016-07-11 DIAGNOSIS — R7989 Other specified abnormal findings of blood chemistry: Secondary | ICD-10-CM

## 2016-07-11 DIAGNOSIS — Z0000 Encounter for general adult medical examination without abnormal findings: Secondary | ICD-10-CM | POA: Diagnosis not present

## 2016-07-11 DIAGNOSIS — Z1159 Encounter for screening for other viral diseases: Secondary | ICD-10-CM

## 2016-07-11 LAB — CBC
HEMATOCRIT: 41.2 % (ref 36.0–46.0)
HEMOGLOBIN: 14 g/dL (ref 12.0–15.0)
MCHC: 34.1 g/dL (ref 30.0–36.0)
MCV: 94.7 fl (ref 78.0–100.0)
Platelets: 254 10*3/uL (ref 150.0–400.0)
RBC: 4.35 Mil/uL (ref 3.87–5.11)
RDW: 13 % (ref 11.5–15.5)
WBC: 4.4 10*3/uL (ref 4.0–10.5)

## 2016-07-11 LAB — LIPID PANEL
CHOL/HDL RATIO: 3
Cholesterol: 239 mg/dL — ABNORMAL HIGH (ref 0–200)
HDL: 86.9 mg/dL (ref >39.00)
NONHDL: 152.32
TRIGLYCERIDES: 252 mg/dL — AB (ref 0.0–149.0)
VLDL: 50.4 mg/dL — ABNORMAL HIGH (ref 0.0–40.0)

## 2016-07-11 LAB — COMPREHENSIVE METABOLIC PANEL
ALT: 32 U/L (ref 0–35)
AST: 26 U/L (ref 0–37)
Albumin: 4.3 g/dL (ref 3.5–5.2)
Alkaline Phosphatase: 64 U/L (ref 39–117)
BUN: 12 mg/dL (ref 6–23)
CHLORIDE: 106 meq/L (ref 96–112)
CO2: 29 meq/L (ref 19–32)
Calcium: 9.3 mg/dL (ref 8.4–10.5)
Creatinine, Ser: 0.86 mg/dL (ref 0.40–1.20)
GFR: 70.71 mL/min (ref >60.00)
GLUCOSE: 94 mg/dL (ref 70–99)
POTASSIUM: 5 meq/L (ref 3.5–5.1)
SODIUM: 143 meq/L (ref 135–145)
Total Bilirubin: 0.5 mg/dL (ref 0.2–1.2)
Total Protein: 7.4 g/dL (ref 6.0–8.3)

## 2016-07-11 LAB — LDL CHOLESTEROL, DIRECT: Direct LDL: 100 mg/dL

## 2016-07-11 LAB — TSH: TSH: 1.98 u[IU]/mL (ref 0.35–4.50)

## 2016-07-11 LAB — VITAMIN D 25 HYDROXY (VIT D DEFICIENCY, FRACTURES): VITD: 35.38 ng/mL (ref 30.00–100.00)

## 2016-07-12 LAB — HEPATITIS C ANTIBODY: HCV Ab: NEGATIVE

## 2016-07-13 ENCOUNTER — Encounter: Payer: PRIVATE HEALTH INSURANCE | Admitting: Internal Medicine

## 2016-08-30 ENCOUNTER — Telehealth: Payer: Self-pay | Admitting: Internal Medicine

## 2016-08-30 NOTE — Telephone Encounter (Signed)
Already mailed out

## 2016-08-30 NOTE — Telephone Encounter (Signed)
Pt would like last lab work result mailed out to her and every time she has blood work she wants them mailed to her even tho she can view them on [employer]

## 2016-09-14 ENCOUNTER — Encounter: Payer: Self-pay | Admitting: Gastroenterology

## 2016-09-22 ENCOUNTER — Encounter: Payer: PRIVATE HEALTH INSURANCE | Admitting: Gastroenterology

## 2016-09-27 ENCOUNTER — Telehealth: Payer: Self-pay | Admitting: Internal Medicine

## 2016-09-27 NOTE — Telephone Encounter (Signed)
Patient Name: Sharon Mcgee  DOB: January 07, 1951    Initial Comment Caller says she had a cold and cough at the beginning of January. She started coughing again and she had a z-pack and took that, but she is still coughing.    Nurse Assessment  Nurse: Raphael Gibney, RN, Vera Date/Time (Eastern Time): 09/27/2016 1:41:10 PM  Confirm and document reason for call. If symptomatic, describe symptoms. ---Caller states she had cold and cough in January which went away. She has a cough for 3 weeks. She took a Z pack for 5 days and finished it about 10 days ago. No fever. Has a little wheezing in the am.  Does the patient have any new or worsening symptoms? ---Yes  Will a triage be completed? ---Yes  Related visit to physician within the last 2 weeks? ---No  Does the PT have any chronic conditions? (i.e. diabetes, asthma, etc.) ---No  Is this a behavioral health or substance abuse call? ---No     Guidelines    Guideline Title Affirmed Question Affirmed Notes  Cough - Chronic Cough lasts > 3 weeks (all triage questions negative)    Final Disposition User   See PCP When Office is Open (within 3 days) Raphael Gibney, RN, Vanita Ingles    Comments  Triage outcome upgraded to see physician within 72 hrs as pt has had cough for 3 weeks and has had coughing spells at times.  Appt scheduled for 09/28/16 at 4 pm with Dr. Pricilla Holm   Disagree/Comply: Comply

## 2016-09-28 ENCOUNTER — Encounter: Payer: Self-pay | Admitting: Internal Medicine

## 2016-09-28 ENCOUNTER — Ambulatory Visit (INDEPENDENT_AMBULATORY_CARE_PROVIDER_SITE_OTHER): Payer: PRIVATE HEALTH INSURANCE | Admitting: Internal Medicine

## 2016-09-28 DIAGNOSIS — R05 Cough: Secondary | ICD-10-CM | POA: Diagnosis not present

## 2016-09-28 DIAGNOSIS — R059 Cough, unspecified: Secondary | ICD-10-CM

## 2016-09-28 MED ORDER — HYDROCODONE-HOMATROPINE 5-1.5 MG/5ML PO SYRP: 5.0000 mL | ORAL_SOLUTION | Freq: Three times a day (TID) | ORAL | 0 refills | Status: DC | PRN

## 2016-09-28 NOTE — Progress Notes (Signed)
Pre visit review using our clinic review tool, if applicable. No additional management support is needed unless otherwise documented below in the visit note. 

## 2016-09-28 NOTE — Progress Notes (Signed)
   Subjective:    Patient ID: Sharon Mcgee, female    DOB: 06-19-1951, 66 y.o.   MRN: [id number]  HPI The patient is a 66 YO female coming in for cough for about 3 weeks. She took a z-pack that she had leftover from another time. She took it all and this is starting to improve. She was extra concerned as her husband caught this from her and ended up getting pneumonia and in the hospital for about 10 days. She is still coughing some, denies SOB. No more fevers or chills. Nasal congestion is minimal. Cough is still disturbing her sleep sometimes. Has taken some otc mucinex like product.   Review of Systems  Constitutional: Positive for activity change and appetite change. Negative for chills, fatigue, fever and unexpected weight change.  HENT: Positive for congestion. Negative for ear discharge, ear pain, postnasal drip, rhinorrhea, sinus pain, sinus pressure, sore throat and trouble swallowing.   Eyes: Negative.   Respiratory: Positive for cough. Negative for chest tightness, shortness of breath and wheezing.   Cardiovascular: Negative.   Gastrointestinal: Negative.   Musculoskeletal: Negative.   Neurological: Negative.       Objective:   Physical Exam  Constitutional: She appears well-developed and well-nourished.  HENT:  Head: Normocephalic and atraumatic.  Right Ear: External ear normal.  Left Ear: External ear normal.  Oropharynx with redness and clear drainage.   Eyes: EOM are normal.  Neck: Normal range of motion. No JVD present.  Cardiovascular: Normal rate and regular rhythm.   Pulmonary/Chest: Effort normal and breath sounds normal. No respiratory distress. She has no wheezes. She has no rales.  Abdominal: Soft.  Lymphadenopathy:    She has no cervical adenopathy.   Vitals:   09/28/16 1559  BP: 128/70  Pulse: 66  Temp: 98.3 F (36.8 C)  TempSrc: Oral  SpO2: 100%  Weight: 194 lb (88 kg)  Height: 5' 6.5" (1.689 m)      Assessment & Plan:

## 2016-09-28 NOTE — Patient Instructions (Signed)
We have given you the cough medicine to fill and use at night time.

## 2016-09-29 DIAGNOSIS — R05 Cough: Secondary | ICD-10-CM | POA: Insufficient documentation

## 2016-09-29 DIAGNOSIS — R059 Cough, unspecified: Secondary | ICD-10-CM | POA: Insufficient documentation

## 2016-09-29 NOTE — Assessment & Plan Note (Signed)
Lungs clear, likely resolving viral illness. Rx for hycodan for cough at night time. Talked to her about sinus rinses to help clear the sinuses. No indication for antibiotics or steroids.

## 2017-03-27 ENCOUNTER — Other Ambulatory Visit: Payer: Self-pay | Admitting: Obstetrics and Gynecology

## 2017-03-27 DIAGNOSIS — Z1231 Encounter for screening mammogram for malignant neoplasm of breast: Secondary | ICD-10-CM

## 2017-04-11 ENCOUNTER — Ambulatory Visit: Payer: PRIVATE HEALTH INSURANCE

## 2017-04-18 ENCOUNTER — Ambulatory Visit
Admission: RE | Admit: 2017-04-18 | Discharge: 2017-04-18 | Disposition: A | Discharge status: Home / Self Care | Payer: PRIVATE HEALTH INSURANCE | Source: Ambulatory Visit | Attending: Obstetrics and Gynecology | Admitting: Obstetrics and Gynecology

## 2017-04-18 DIAGNOSIS — Z1231 Encounter for screening mammogram for malignant neoplasm of breast: Secondary | ICD-10-CM

## 2017-07-12 ENCOUNTER — Encounter: Payer: PRIVATE HEALTH INSURANCE | Admitting: Internal Medicine

## 2017-07-13 ENCOUNTER — Encounter: Payer: Self-pay | Admitting: Internal Medicine

## 2017-07-13 ENCOUNTER — Ambulatory Visit (INDEPENDENT_AMBULATORY_CARE_PROVIDER_SITE_OTHER): Payer: PRIVATE HEALTH INSURANCE | Admitting: Internal Medicine

## 2017-07-13 ENCOUNTER — Other Ambulatory Visit (INDEPENDENT_AMBULATORY_CARE_PROVIDER_SITE_OTHER): Payer: PRIVATE HEALTH INSURANCE

## 2017-07-13 VITALS — BP 130/86 | HR 69 | Temp 97.6°F | Ht 66.5 in | Wt 204.0 lb

## 2017-07-13 DIAGNOSIS — Z0000 Encounter for general adult medical examination without abnormal findings: Secondary | ICD-10-CM | POA: Diagnosis not present

## 2017-07-13 LAB — COMPREHENSIVE METABOLIC PANEL
ALT: 26 U/L (ref 0–35)
AST: 22 U/L (ref 0–37)
Albumin: 4.4 g/dL (ref 3.5–5.2)
Alkaline Phosphatase: 64 U/L (ref 39–117)
BUN: 16 mg/dL (ref 6–23)
CO2: 26 meq/L (ref 19–32)
Calcium: 9 mg/dL (ref 8.4–10.5)
Chloride: 103 mEq/L (ref 96–112)
Creatinine, Ser: 0.75 mg/dL (ref 0.40–1.20)
GFR: 82.54 mL/min (ref >60.00)
GLUCOSE: 90 mg/dL (ref 70–99)
POTASSIUM: 4 meq/L (ref 3.5–5.1)
Sodium: 139 mEq/L (ref 135–145)
Total Bilirubin: 0.6 mg/dL (ref 0.2–1.2)
Total Protein: 7 g/dL (ref 6.0–8.3)

## 2017-07-13 LAB — CBC
HEMATOCRIT: 40.1 % (ref 36.0–46.0)
HEMOGLOBIN: 13.5 g/dL (ref 12.0–15.0)
MCHC: 33.7 g/dL (ref 30.0–36.0)
MCV: 95.6 fl (ref 78.0–100.0)
Platelets: 259 10*3/uL (ref 150.0–400.0)
RBC: 4.2 Mil/uL (ref 3.87–5.11)
RDW: 12.5 % (ref 11.5–15.5)
WBC: 6 10*3/uL (ref 4.0–10.5)

## 2017-07-13 LAB — LIPID PANEL
CHOL/HDL RATIO: 2
Cholesterol: 193 mg/dL (ref 0–200)
HDL: 81.5 mg/dL (ref >39.00)
LDL Cholesterol: 96 mg/dL (ref 0–99)
NONHDL: 111.74
Triglycerides: 77 mg/dL (ref 0.0–149.0)
VLDL: 15.4 mg/dL (ref 0.0–40.0)

## 2017-07-13 LAB — TSH: TSH: 1.36 u[IU]/mL (ref 0.35–4.50)

## 2017-07-13 LAB — HEMOGLOBIN A1C: Hgb A1c MFr Bld: 5.4 % (ref 4.6–6.5)

## 2017-07-13 LAB — VITAMIN D 25 HYDROXY (VIT D DEFICIENCY, FRACTURES): VITD: 34.83 ng/mL (ref 30.00–100.00)

## 2017-07-13 MED ORDER — HYDROCORTISONE 2.5 % RE CREA: TOPICAL_CREAM | Freq: Two times a day (BID) | RECTAL | 11 refills | Status: DC | PRN

## 2017-07-13 MED ORDER — ZOSTER VAC RECOMB ADJUVANTED 50 MCG/0.5ML IM SUSR: 0.5000 mL | Freq: Once | INTRAMUSCULAR | 1 refills | Status: AC

## 2017-07-13 NOTE — Assessment & Plan Note (Signed)
Will schedule colonoscopy for January or later, mammogram up to date and pap smear. Counseled about shingrix and sent to pharmacy. Flu and tetanus up to date. Given screening recommendations. Sun safety and dangers of distracted driving discussed.

## 2017-07-13 NOTE — Progress Notes (Signed)
   Subjective:    Patient ID: Sharon Mcgee, female    DOB: 1951/01/01, 66 y.o.   MRN: [id number]  HPI The patient is a 66 YO female coming in for physical.   PMH, [facility], social history reviewed and updated.   Review of Systems  Constitutional: Negative.   HENT: Negative.   Eyes: Negative.   Respiratory: Negative for cough, chest tightness and shortness of breath.   Cardiovascular: Negative for chest pain, palpitations and leg swelling.  Gastrointestinal: Negative for abdominal distention, abdominal pain, constipation, diarrhea, nausea and vomiting.  Musculoskeletal: Negative.   Skin: Negative.   Neurological: Negative.   Psychiatric/Behavioral: Negative.       Objective:   Physical Exam  Constitutional: She is oriented to person, place, and time. She appears well-developed and well-nourished.  HENT:  Head: Normocephalic and atraumatic.  Eyes: EOM are normal.  Neck: Normal range of motion.  Cardiovascular: Normal rate and regular rhythm.  Pulmonary/Chest: Effort normal and breath sounds normal. No respiratory distress. She has no wheezes. She has no rales.  Abdominal: Soft. Bowel sounds are normal. She exhibits no distension. There is no tenderness. There is no rebound.  Musculoskeletal: She exhibits no edema.  Neurological: She is alert and oriented to person, place, and time. Coordination normal.  Skin: Skin is warm and dry.  Psychiatric: She has a normal mood and affect.   Vitals:   07/13/17 1301  BP: 130/86  Pulse: 69  Temp: 97.6 F (36.4 C)  TempSrc: Oral  SpO2: 100%  Weight: 204 lb (92.5 kg)  Height: 5' 6.5" (1.689 m)      Assessment & Plan:

## 2017-07-13 NOTE — Patient Instructions (Signed)
We have sent the shingles vaccine to the [facility] pharmacy to get when you want. Get 1 and then the next one about 2 months later.   Health Maintenance, Female Adopting a healthy lifestyle and getting preventive care can go a long way to promote health and wellness. Talk with your health care provider about what schedule of regular examinations is right for you. This is a good chance for you to check in with your provider about disease prevention and staying healthy. In between checkups, there are plenty of things you can do on your own. Experts have done a lot of research about which lifestyle changes and preventive measures are most likely to keep you healthy. Ask your health care provider for more information. Weight and diet Eat a healthy diet  Be sure to include plenty of vegetables, fruits, low-fat dairy products, and lean protein.  Do not eat a lot of foods high in solid fats, added sugars, or salt.  Get regular exercise. This is one of the most important things you can do for your health. ? Most adults should exercise for at least 150 minutes each week. The exercise should increase your heart rate and make you sweat (moderate-intensity exercise). ? Most adults should also do strengthening exercises at least twice a week. This is in addition to the moderate-intensity exercise.  Maintain a healthy weight  Body mass index (BMI) is a measurement that can be used to identify possible weight problems. It estimates body fat based on height and weight. Your health care provider can help determine your BMI and help you achieve or maintain a healthy weight.  For females 20 years of age and older: ? A BMI below 18.5 is considered underweight. ? A BMI of 18.5 to 24.9 is normal. ? A BMI of 25 to 29.9 is considered overweight. ? A BMI of 30 and above is considered obese.  Watch levels of cholesterol and blood lipids  You should start having your blood tested for lipids and  cholesterol at 64 years of age, then have this test every 5 years.  You may need to have your cholesterol levels checked more often if: ? Your lipid or cholesterol levels are high. ? You are older than 64 years of age. ? You are at high risk for heart disease.  Cancer screening Lung Cancer  Lung cancer screening is recommended for adults 55-80 years old who are at high risk for lung cancer because of a history of smoking.  A yearly low-dose CT scan of the lungs is recommended for people who: ? Currently smoke. ? Have quit within the past 15 years. ? Have at least a 30-pack-year history of smoking. A pack year is smoking an average of one pack of cigarettes a day for 1 year.  Yearly screening should continue until it has been 15 years since you quit.  Yearly screening should stop if you develop a health problem that would prevent you from having lung cancer treatment.  Breast Cancer  Practice breast self-awareness. This means understanding how your breasts normally appear and feel.  It also means doing regular breast self-exams. Let your health care provider know about any changes, no matter how small.  If you are in your 20s or 30s, you should have a clinical breast exam (CBE) by a health care provider every 1-3 years as part of a regular health exam.  If you are 40 or older, have a CBE every year. Also consider having a breast   X-ray (mammogram) every year.  If you have a family history of breast cancer, talk to your health care provider about genetic screening.  If you are at high risk for breast cancer, talk to your health care provider about having an MRI and a mammogram every year.  Breast cancer gene (BRCA) assessment is recommended for women who have family members with BRCA-related cancers. BRCA-related cancers include: ? Breast. ? Ovarian. ? Tubal. ? Peritoneal cancers.  Results of the assessment will determine the need for genetic counseling and BRCA1 and BRCA2  testing.  Cervical Cancer Your health care provider may recommend that you be screened regularly for cancer of the pelvic organs (ovaries, uterus, and vagina). This screening involves a pelvic examination, including checking for microscopic changes to the surface of your cervix (Pap test). You may be encouraged to have this screening done every 3 years, beginning at age 21.  For women ages 30-65, health care providers may recommend pelvic exams and Pap testing every 3 years, or they may recommend the Pap and pelvic exam, combined with testing for human papilloma virus (HPV), every 5 years. Some types of HPV increase your risk of cervical cancer. Testing for HPV may also be done on women of any age with unclear Pap test results.  Other health care providers may not recommend any screening for nonpregnant women who are considered low risk for pelvic cancer and who do not have symptoms. Ask your health care provider if a screening pelvic exam is right for you.  If you have had past treatment for cervical cancer or a condition that could lead to cancer, you need Pap tests and screening for cancer for at least 20 years after your treatment. If Pap tests have been discontinued, your risk factors (such as having a new sexual partner) need to be reassessed to determine if screening should resume. Some women have medical problems that increase the chance of getting cervical cancer. In these cases, your health care provider may recommend more frequent screening and Pap tests.  Colorectal Cancer  This type of cancer can be detected and often prevented.  Routine colorectal cancer screening usually begins at 64 years of age and continues through 64 years of age.  Your health care provider may recommend screening at an earlier age if you have risk factors for colon cancer.  Your health care provider may also recommend using home test kits to check for hidden blood in the stool.  A small camera at the end of a  tube can be used to examine your colon directly (sigmoidoscopy or colonoscopy). This is done to check for the earliest forms of colorectal cancer.  Routine screening usually begins at age 50.  Direct examination of the colon should be repeated every 5-10 years through 64 years of age. However, you may need to be screened more often if early forms of precancerous polyps or small growths are found.  Skin Cancer  Check your skin from head to toe regularly.  Tell your health care provider about any new moles or changes in moles, especially if there is a change in a mole's shape or color.  Also tell your health care provider if you have a mole that is larger than the size of a pencil eraser.  Always use sunscreen. Apply sunscreen liberally and repeatedly throughout the day.  Protect yourself by wearing long sleeves, pants, a wide-brimmed hat, and sunglasses whenever you are outside.  Heart disease, diabetes, and high blood pressure  High blood   pressure causes heart disease and increases the risk of stroke. High blood pressure is more likely to develop in: ? People who have blood pressure in the high end of the normal range (130-139/85-89 mm Hg). ? People who are overweight or obese. ? People who are African American.  If you are 18-39 years of age, have your blood pressure checked every 3-5 years. If you are 40 years of age or older, have your blood pressure checked every year. You should have your blood pressure measured twice-once when you are at a hospital or clinic, and once when you are not at a hospital or clinic. Record the average of the two measurements. To check your blood pressure when you are not at a hospital or clinic, you can use: ? An automated blood pressure machine at a pharmacy. ? A home blood pressure monitor.  If you are between 55 years and 79 years old, ask your health care provider if you should take aspirin to prevent strokes.  Have regular diabetes screenings. This  involves taking a blood sample to check your fasting blood sugar level. ? If you are at a normal weight and have a low risk for diabetes, have this test once every three years after 64 years of age. ? If you are overweight and have a high risk for diabetes, consider being tested at a younger age or more often. Preventing infection Hepatitis B  If you have a higher risk for hepatitis B, you should be screened for this virus. You are considered at high risk for hepatitis B if: ? You were born in a country where hepatitis B is common. Ask your health care provider which countries are considered high risk. ? Your parents were born in a high-risk country, and you have not been immunized against hepatitis B (hepatitis B vaccine). ? You have HIV or AIDS. ? You use needles to inject street drugs. ? You live with someone who has hepatitis B. ? You have had sex with someone who has hepatitis B. ? You get hemodialysis treatment. ? You take certain medicines for conditions, including cancer, organ transplantation, and autoimmune conditions.  Hepatitis C  Blood testing is recommended for: ? Everyone born from 1945 through 1965. ? Anyone with known risk factors for hepatitis C.  Sexually transmitted infections (STIs)  You should be screened for sexually transmitted infections (STIs) including gonorrhea and chlamydia if: ? You are sexually active and are younger than 64 years of age. ? You are older than 64 years of age and your health care provider tells you that you are at risk for this type of infection. ? Your sexual activity has changed since you were last screened and you are at an increased risk for chlamydia or gonorrhea. Ask your health care provider if you are at risk.  If you do not have HIV, but are at risk, it may be recommended that you take a prescription medicine daily to prevent HIV infection. This is called pre-exposure prophylaxis (PrEP). You are considered at risk if: ? You are  sexually active and do not regularly use condoms or know the HIV status of your partner(s). ? You take drugs by injection. ? You are sexually active with a partner who has HIV.  Talk with your health care provider about whether you are at high risk of being infected with HIV. If you choose to begin PrEP, you should first be tested for HIV. You should then be tested every 3 months for as   long as you are taking PrEP. Pregnancy  If you are premenopausal and you may become pregnant, ask your health care provider about preconception counseling.  If you may become pregnant, take 400 to 800 micrograms (mcg) of folic acid every day.  If you want to prevent pregnancy, talk to your health care provider about birth control (contraception). Osteoporosis and menopause  Osteoporosis is a disease in which the bones lose minerals and strength with aging. This can result in serious bone fractures. Your risk for osteoporosis can be identified using a bone density scan.  If you are 21 years of age or older, or if you are at risk for osteoporosis and fractures, ask your health care provider if you should be screened.  Ask your health care provider whether you should take a calcium or vitamin D supplement to lower your risk for osteoporosis.  Menopause may have certain physical symptoms and risks.  Hormone replacement therapy may reduce some of these symptoms and risks. Talk to your health care provider about whether hormone replacement therapy is right for you. Follow these instructions at home:  Schedule regular health, dental, and eye exams.  Stay current with your immunizations.  Do not use any tobacco products including cigarettes, chewing tobacco, or electronic cigarettes.  If you are pregnant, do not drink alcohol.  If you are breastfeeding, limit how much and how often you drink alcohol.  Limit alcohol intake to no more than 1 drink per day for nonpregnant women. One drink equals 12 ounces of  beer, 5 ounces of wine, or 1 ounces of hard liquor.  Do not use street drugs.  Do not share needles.  Ask your health care provider for help if you need support or information about quitting drugs.  Tell your health care provider if you often feel depressed.  Tell your health care provider if you have ever been abused or do not feel safe at home. This information is not intended to replace advice given to you by your health care provider. Make sure you discuss any questions you have with your health care provider. Document Released: 02/06/2011 Document Revised: 12/30/2015 Document Reviewed: 04/27/2015 Elsevier Interactive Patient Education  [employer].

## 2017-08-06 ENCOUNTER — Ambulatory Visit: Payer: Self-pay

## 2017-08-06 NOTE — Telephone Encounter (Signed)
Pt. called and reported 6 day hx. Of cough; with intermittent episodes of persistent cough.  Reported sore throat, post nasal drip, and coughing up "light yellow mucus." Denied fever.  Reported some discomfort, last week, in upper back, "shoulder blade area", that has subsided.  Reported taking Mucinex, Echinacea, Ginger, lemon and honey @ home.  Denied shortness of breath or chest pain.  Per protocol, scheduled an appt. 08/08/17 @ [facility]; pt. Requested an appt. In [place], instead of [place], due to her work location. Care advise per protocol.  Verb. Understanding.  Agrees with plan.    Reason for Disposition . [1] Continuous (nonstop) coughing interferes with work or school AND [2] no improvement using cough treatment per Care Advice  Answer Assessment - Initial Assessment Questions 1. ONSET: "When did the cough begin?"      Christmas Day 2. SEVERITY: "How bad is the cough today?"      1st 4-5 days the cough was constant; now it is frequent when 1st getting up in the AM.  3. RESPIRATORY DISTRESS: "Describe your breathing."      Denies shortness of breath 4. FEVER: "Do you have a fever?" If so, ask: "What is your temperature, how was it measured, and when did it start?"    No fever 5. SPUTUM: "Describe the color of your sputum" (clear, white, yellow, green)     Thick, lt. yellow 6. HEMOPTYSIS: "Are you coughing up any blood?" If so ask: "How much?" (flecks, streaks, tablespoons, etc.)     no 7. CARDIAC HISTORY: "Do you have any history of heart disease?" (e.g., heart attack, congestive heart failure)      no 8. LUNG HISTORY: "Do you have any history of lung disease?"  (e.g., pulmonary embolus, asthma, emphysema)     no 9. PE RISK FACTORS: "Do you have a history of blood clots?" (or: recent major surgery, recent prolonged travel, bedridden )     no 10. OTHER SYMPTOMS: "Do you have any other symptoms?" (e.g., runny nose, wheezing, chest pain)       No wheezing; stuffy nose beginning, back  of chest hurting in shoulder blades last week (now subsided), slight headache 11. PREGNANCY: "Is there any chance you are pregnant?" "When was your last menstrual period?"       no 12. TRAVEL: "Have you traveled out of the country in the last month?" (e.g., travel history, exposures)      no  Protocols used: [place]

## 2017-08-08 ENCOUNTER — Ambulatory Visit: Payer: PRIVATE HEALTH INSURANCE | Admitting: Family Medicine

## 2017-08-08 ENCOUNTER — Ambulatory Visit (INDEPENDENT_AMBULATORY_CARE_PROVIDER_SITE_OTHER): Payer: PRIVATE HEALTH INSURANCE | Admitting: Family

## 2017-08-08 ENCOUNTER — Encounter: Payer: Self-pay | Admitting: Gastroenterology

## 2017-08-08 ENCOUNTER — Encounter: Payer: Self-pay | Admitting: Family

## 2017-08-08 VITALS — BP 128/72 | HR 82 | Wt 206.0 lb

## 2017-08-08 DIAGNOSIS — J019 Acute sinusitis, unspecified: Secondary | ICD-10-CM

## 2017-08-08 DIAGNOSIS — J209 Acute bronchitis, unspecified: Secondary | ICD-10-CM | POA: Diagnosis not present

## 2017-08-08 MED ORDER — BENZONATATE 200 MG PO CAPS
200.0000 mg | ORAL_CAPSULE | Freq: Three times a day (TID) | ORAL | 0 refills | Status: DC | PRN
Start: 2017-08-08 — End: 2018-02-18

## 2017-08-08 MED ORDER — FLUTICASONE PROPIONATE 50 MCG/ACT NA SUSP: 2.0000 | Freq: Every day | NASAL | 6 refills | Status: DC

## 2017-08-08 MED ORDER — DOXYCYCLINE HYCLATE 100 MG PO TABS: 100.0000 mg | ORAL_TABLET | Freq: Two times a day (BID) | ORAL | 0 refills | Status: DC

## 2017-08-08 NOTE — Progress Notes (Signed)
Sharon Mcgee is a 67 y.o. female with the following history as recorded in EpicCare:  Patient Active Problem List   Diagnosis Date Noted  . Vitamin D deficiency 10/16/2011  . Routine health maintenance 10/16/2011  . Osteopenia 07/01/2008  . History of tobacco abuse 06/30/2008  . Hearing loss 06/30/2008    Current Outpatient Medications  Medication Sig Dispense Refill  . acyclovir ointment (ZOVIRAX) 5 %   1  . b complex vitamins capsule Take 1 capsule by mouth daily.    . Cholecalciferol (VITAMIN D3) 2000 units TABS Take by mouth.    . hydrocortisone (PROCTOZONE-HC) 2.5 % rectal cream Place rectally 2 (two) times daily as needed. 30 g 11  . magnesium gluconate (MAGONATE) 500 MG tablet Take 500 mg by mouth 2 (two) times daily.      [name] omega-3 acid ethyl esters (LOVAZA) 1 g capsule Take by mouth 2 (two) times daily.    . ranitidine (ZANTAC) 75 MG tablet Take 75 mg by mouth 2 (two) times daily as needed.     . vitamin E (VITAMIN E) 400 UNIT capsule Take 400 Units by mouth daily.    . benzonatate (TESSALON) 200 MG capsule Take 1 capsule (200 mg total) by mouth 3 (three) times daily as needed for cough. 30 capsule 0  . doxycycline (VIBRA-TABS) 100 MG tablet Take 1 tablet (100 mg total) by mouth 2 (two) times daily. 20 tablet 0  . fluticasone (FLONASE) 50 MCG/ACT nasal spray Place 2 sprays into both nostrils daily. 16 g 6   No current facility-administered medications for this visit.     Allergies: Patient has no known allergies.  Past Medical History:  Diagnosis Date  . Abdominal pain, RUQ   . Hearing loss   . Osteopenia   . Tobacco use disorder     Past Surgical History:  Procedure Laterality Date  . laproscopy     fertility work up  . uterine polypectomy      Family History  Problem Relation Age of Onset  . Melanoma Father     Social History   Tobacco Use  . Smoking status: Former Research scientist (life sciences)  . Smokeless tobacco: Never Used  Substance Use Topics  . Alcohol use: Yes     Subjective:  Started with laryngitis on 12/25; progressed into "deep productive cough." + sweating at night; + frontal headache; "teeth pressure." Pressure behind eyes; Used OTC Mucinex with little benefit;    Objective:  Vitals:   08/08/17 1307  BP: 128/72  Pulse: 82  SpO2: 99%  Weight: 206 lb (93.4 kg)    General: Well developed, well nourished, in no acute distress  Skin : Warm and dry.  Head: Normocephalic and atraumatic  Eyes: Sclera and conjunctiva clear; pupils round and reactive to light; extraocular movements intact  Ears: External normal; canals clear; tympanic membranes congested bilaterally Oropharynx: Pink, supple. No suspicious lesions  Neck: Supple without thyromegaly, adenopathy  Lungs: Respirations unlabored; coarse breath sounds bilateral lower lobes CVS exam: normal rate and regular rhythm.  Abdomen: Soft; nontender; nondistended; normoactive bowel sounds; no masses or hepatosplenomegaly  Musculoskeletal: No deformities; no active joint inflammation  Extremities: No edema, cyanosis, clubbing  Vessels: Symmetric bilaterally  Neurologic: Alert and oriented; speech intact; face symmetrical; moves all extremities well; CNII-XII intact without focal deficit   Assessment:  1. Acute sinusitis, recurrence not specified, unspecified location   2. Acute bronchitis, unspecified organism     Plan:  Rx for Doxycycline, Flonase and Tessalon Perles;  increase fluids, rest and follow-up worse, no better.   No Follow-up on file.  No orders of the defined types were placed in this encounter.   Requested Prescriptions   Signed Prescriptions Disp Refills  . doxycycline (VIBRA-TABS) 100 MG tablet 20 tablet 0    Sig: Take 1 tablet (100 mg total) by mouth 2 (two) times daily.  . fluticasone (FLONASE) 50 MCG/ACT nasal spray 16 g 6    Sig: Place 2 sprays into both nostrils daily.  . benzonatate (TESSALON) 200 MG capsule 30 capsule 0    Sig: Take 1 capsule (200 mg total) by  mouth 3 (three) times daily as needed for cough.

## 2017-10-05 ENCOUNTER — Ambulatory Visit (AMBULATORY_SURGERY_CENTER): Payer: Self-pay

## 2017-10-05 ENCOUNTER — Other Ambulatory Visit: Payer: Self-pay

## 2017-10-05 VITALS — Ht 67.0 in | Wt 207.0 lb

## 2017-10-05 DIAGNOSIS — Z1211 Encounter for screening for malignant neoplasm of colon: Secondary | ICD-10-CM

## 2017-10-05 MED ORDER — NA SULFATE-K SULFATE-MG SULF 17.5-3.13-1.6 GM/177ML PO SOLN: 1.0000 | Freq: Once | ORAL | 0 refills | Status: AC

## 2017-10-05 NOTE — Progress Notes (Signed)
Denies allergies to eggs or soy products. Denies complication of anesthesia or sedation. Denies use of weight loss medication. Denies use of O2.   Emmi instructions declined.  

## 2017-10-18 MED FILL — SHINGRIX 50 MCG SUS: 50 | 2 days supply | Qty: 2 | Fill #0

## 2017-10-19 ENCOUNTER — Encounter: Payer: Self-pay | Admitting: Gastroenterology

## 2017-10-19 ENCOUNTER — Ambulatory Visit (AMBULATORY_SURGERY_CENTER): Payer: PRIVATE HEALTH INSURANCE | Admitting: Gastroenterology

## 2017-10-19 VITALS — BP 112/71 | HR 57 | Temp 96.8°F | Resp 20 | Ht 67.0 in | Wt 207.0 lb

## 2017-10-19 DIAGNOSIS — K635 Polyp of colon: Secondary | ICD-10-CM | POA: Diagnosis not present

## 2017-10-19 DIAGNOSIS — Z1211 Encounter for screening for malignant neoplasm of colon: Secondary | ICD-10-CM

## 2017-10-19 DIAGNOSIS — D124 Benign neoplasm of descending colon: Secondary | ICD-10-CM | POA: Diagnosis not present

## 2017-10-19 DIAGNOSIS — D125 Benign neoplasm of sigmoid colon: Secondary | ICD-10-CM | POA: Diagnosis not present

## 2017-10-19 MED ORDER — SODIUM CHLORIDE 0.9 % IV SOLN: 500.0000 mL | Freq: Once | INTRAVENOUS | Status: DC

## 2017-10-19 NOTE — Progress Notes (Signed)
A/ox3 pleased with MAC, report to [facility]

## 2017-10-19 NOTE — Patient Instructions (Signed)
Impression/recommendations:  Polyps (handout given)  YOU HAD AN ENDOSCOPIC PROCEDURE TODAY AT THE [facility]:   Refer to the procedure report that was given to you for any specific questions about what was found during the examination.  If the procedure report does not answer your questions, please call your gastroenterologist to clarify.  If you requested that your care partner not be given the details of your procedure findings, then the procedure report has been included in a sealed envelope for you to review at your convenience later.  YOU SHOULD EXPECT: Some feelings of bloating in the abdomen. Passage of more gas than usual.  Walking can help get rid of the air that was put into your GI tract during the procedure and reduce the bloating. If you had a lower endoscopy (such as a colonoscopy or flexible sigmoidoscopy) you may notice spotting of blood in your stool or on the toilet paper. If you underwent a bowel prep for your procedure, you may not have a normal bowel movement for a few days.  Please Note:  You might notice some irritation and congestion in your nose or some drainage.  This is from the oxygen used during your procedure.  There is no need for concern and it should clear up in a day or so.  SYMPTOMS TO REPORT IMMEDIATELY:   Following lower endoscopy (colonoscopy or flexible sigmoidoscopy):  Excessive amounts of blood in the stool  Significant tenderness or worsening of abdominal pains  Swelling of the abdomen that is new, acute  Fever of 100F or higher  For urgent or emergent issues, a gastroenterologist can be reached at any hour by calling [phone].   DIET:  We do recommend a small meal at first, but then you may proceed to your regular diet.  Drink plenty of fluids but you should avoid alcoholic beverages for 24 hours.  ACTIVITY:  You should plan to take it easy for the rest of today and you should NOT DRIVE or use heavy machinery until tomorrow  (because of the sedation medicines used during the test).    FOLLOW UP: Our staff will call the number listed on your records the next business day following your procedure to check on you and address any questions or concerns that you may have regarding the information given to you following your procedure. If we do not reach you, we will leave a message.  However, if you are feeling well and you are not experiencing any problems, there is no need to return our call.  We will assume that you have returned to your regular daily activities without incident.  If any biopsies were taken you will be contacted by phone or by letter within the next 1-3 weeks.  Please call us at [phone] if you have not heard about the biopsies in 3 weeks.    SIGNATURES/CONFIDENTIALITY: You and/or your care partner have signed paperwork which will be entered into your electronic medical record.  These signatures attest to the fact that that the information above on your After Visit Summary has been reviewed and is understood.  Full responsibility of the confidentiality of this discharge information lies with you and/or your care-partner. 

## 2017-10-19 NOTE — Progress Notes (Signed)
Called to room to assist during endoscopic procedure.  Patient ID and intended procedure confirmed with present staff. Received instructions for my participation in the procedure from the performing physician.  

## 2017-10-19 NOTE — Progress Notes (Signed)
Pt's states no medical or surgical changes since previsit or office visit. 

## 2017-10-19 NOTE — Op Note (Signed)
[facility] Patient Name: Sharon Mcgee Procedure Date: 10/19/2017 10:26 AM MRN: [id number] Endoscopist: Milus Banister , MD Age: 67 Referring MD:  Date of Birth: 1950-09-07 Gender: Female Account #: [id number] Procedure:                Colonoscopy Indications:              Screening for colorectal malignant neoplasm;                            colonoscopy 2007 was normal Medicines:                Monitored Anesthesia Care Procedure:                Pre-Anesthesia Assessment:                           - Prior to the procedure, a History and Physical                            was performed, and patient medications and                            allergies were reviewed. The patient's tolerance of                            previous anesthesia was also reviewed. The risks                            and benefits of the procedure and the sedation                            options and risks were discussed with the patient.                            All questions were answered, and informed consent                            was obtained. Prior Anticoagulants: The patient has                            taken no previous anticoagulant or antiplatelet                            agents. ASA Grade Assessment: II - A patient with                            mild systemic disease. After reviewing the risks                            and benefits, the patient was deemed in                            satisfactory condition to undergo the procedure.  After obtaining informed consent, the colonoscope                            was passed under direct vision. Throughout the                            procedure, the patient's blood pressure, pulse, and                            oxygen saturations were monitored continuously. The                            Colonoscope was introduced through the anus and                            advanced to the the cecum, identified  by                            appendiceal orifice and ileocecal valve. The                            colonoscopy was performed without difficulty. The                            patient tolerated the procedure well. The quality                            of the bowel preparation was good. The ileocecal                            valve, appendiceal orifice, and rectum were                            photographed. Scope In: 10:30:09 AM Scope Out: 10:41:02 AM Scope Withdrawal Time: 0 hours 8 minutes 5 seconds  Total Procedure Duration: 0 hours 10 minutes 53 seconds  Findings:                 Two sessile polyps were found in the sigmoid colon                            and descending colon. The polyps were 2 to 3 mm in                            size. These polyps were removed with a cold snare.                            Resection and retrieval were complete.                           The exam was otherwise without abnormality on                            direct and retroflexion views. Complications:  No immediate complications. Estimated blood loss:                            None. Estimated Blood Loss:     Estimated blood loss: none. Impression:               - Two 2 to 3 mm polyps in the sigmoid colon and in                            the descending colon, removed with a cold snare.                            Resected and retrieved.                           - The examination was otherwise normal on direct                            and retroflexion views. Recommendation:           - Patient has a contact number available for                            emergencies. The signs and symptoms of potential                            delayed complications were discussed with the                            patient. Return to normal activities tomorrow.                            Written discharge instructions were provided to the                            patient.                            - Resume previous diet.                           - Continue present medications.                           You will receive a letter within 2-3 weeks with the                            pathology results and my final recommendations.                           If the polyp(s) is proven to be 'pre-cancerous' on                            pathology, you will need repeat colonoscopy in 5  years. If the polyp(s) is NOT 'precancerous' on                            pathology then you should repeat colon cancer                            screening in 10 years with colonoscopy without need                            for colon cancer screening by any method prior to                            then (including stool testing). Milus Banister, MD 10/19/2017 10:42:42 AM This report has been signed electronically.

## 2017-10-22 ENCOUNTER — Telehealth: Payer: Self-pay

## 2017-10-22 ENCOUNTER — Telehealth: Payer: Self-pay | Admitting: *Deleted

## 2017-10-22 NOTE — Telephone Encounter (Signed)
NO ANSWER, MESSAGE LEFT FOR PATIENT. 

## 2017-10-22 NOTE — Telephone Encounter (Signed)
  Follow up Call-  Call back number 10/19/2017  Post procedure Call Back phone  # [phone]  Permission to leave phone message Yes  Some recent data might be hidden     Patient questions:  Message left to call us if necessary.  Second call.

## 2017-10-26 ENCOUNTER — Encounter: Payer: Self-pay | Admitting: Gastroenterology

## 2018-01-04 DIAGNOSIS — L218 Other seborrheic dermatitis: Secondary | ICD-10-CM | POA: Diagnosis not present

## 2018-01-04 DIAGNOSIS — L814 Other melanin hyperpigmentation: Secondary | ICD-10-CM | POA: Diagnosis not present

## 2018-01-04 DIAGNOSIS — D225 Melanocytic nevi of trunk: Secondary | ICD-10-CM | POA: Diagnosis not present

## 2018-01-04 DIAGNOSIS — D2272 Melanocytic nevi of left lower limb, including hip: Secondary | ICD-10-CM | POA: Diagnosis not present

## 2018-01-04 DIAGNOSIS — D1801 Hemangioma of skin and subcutaneous tissue: Secondary | ICD-10-CM | POA: Diagnosis not present

## 2018-01-04 DIAGNOSIS — D2371 Other benign neoplasm of skin of right lower limb, including hip: Secondary | ICD-10-CM | POA: Diagnosis not present

## 2018-01-04 DIAGNOSIS — D2271 Melanocytic nevi of right lower limb, including hip: Secondary | ICD-10-CM | POA: Diagnosis not present

## 2018-01-04 DIAGNOSIS — D224 Melanocytic nevi of scalp and neck: Secondary | ICD-10-CM | POA: Diagnosis not present

## 2018-01-04 DIAGNOSIS — D2239 Melanocytic nevi of other parts of face: Secondary | ICD-10-CM | POA: Diagnosis not present

## 2018-01-04 DIAGNOSIS — L2089 Other atopic dermatitis: Secondary | ICD-10-CM | POA: Diagnosis not present

## 2018-02-18 ENCOUNTER — Encounter: Payer: Self-pay | Admitting: Internal Medicine

## 2018-02-18 ENCOUNTER — Ambulatory Visit (INDEPENDENT_AMBULATORY_CARE_PROVIDER_SITE_OTHER): Payer: Medicare Other | Admitting: Internal Medicine

## 2018-02-18 VITALS — BP 110/80 | HR 64 | Temp 97.5°F | Ht 67.0 in | Wt 203.0 lb

## 2018-02-18 DIAGNOSIS — R1011 Right upper quadrant pain: Secondary | ICD-10-CM | POA: Diagnosis not present

## 2018-02-18 NOTE — Progress Notes (Signed)
   Subjective:    Patient ID: Sharon Mcgee, female    DOB: Oct 13, 1950, 67 y.o.   MRN: [id number]  HPI The patient is a 67 YO female coming in for ruq pain and low back pain. Started about 1 month or so ago. She gets some dull pain anytime. She will get flares of 10/10 severe pain with certain activities. Not predictable. She can twist and tie shoes without pain. Sometimes sitting in a car going around curves will cause it to hurt. Some pain with eating certain foods but not predictable. She denies nausea or vomiting. Denies constipation or diarrhea.   Review of Systems  Constitutional: Negative.   HENT: Negative.   Eyes: Negative.   Respiratory: Negative for cough, chest tightness and shortness of breath.   Cardiovascular: Negative for chest pain, palpitations and leg swelling.  Gastrointestinal: Positive for abdominal pain. Negative for abdominal distention, constipation, diarrhea, nausea and vomiting.  Musculoskeletal: Positive for back pain.  Skin: Negative.   Neurological: Negative.   Psychiatric/Behavioral: Negative.       Objective:   Physical Exam  Constitutional: She is oriented to person, place, and time. She appears well-developed and well-nourished.  HENT:  Head: Normocephalic and atraumatic.  Eyes: EOM are normal.  Neck: Normal range of motion.  Cardiovascular: Normal rate and regular rhythm.  Pulmonary/Chest: Effort normal and breath sounds normal. No respiratory distress. She has no wheezes. She has no rales.  Abdominal: Soft. Bowel sounds are normal. She exhibits no distension. There is no tenderness. There is no rebound.  Musculoskeletal: She exhibits tenderness. She exhibits no edema.  Pain RUQ without murphy sign, pain midline and right thoracic spinal region  Neurological: She is alert and oriented to person, place, and time. Coordination normal.  Skin: Skin is warm and dry.   Vitals:   02/18/18 1105  BP: 110/80  Pulse: 64  Temp: (!) 97.5 F (36.4 C)    TempSrc: Oral  SpO2: 98%  Weight: 203 lb (92.1 kg)  Height: 5\' 7"  (1.702 m)      Assessment & Plan:

## 2018-02-18 NOTE — Patient Instructions (Signed)
We will get the ultrasound of the stomach.  I would recommend to try taking advil regularly for the next week or so to see if this helps.

## 2018-02-18 NOTE — Assessment & Plan Note (Addendum)
[place] 2011 without gallstones or kidney stones. Checking [place] RUQ to check for gallbladder or kidney problems. Advised scheduling advil to see if this is a muscular problem.

## 2018-02-28 ENCOUNTER — Other Ambulatory Visit: Payer: Medicare Other

## 2018-03-06 ENCOUNTER — Ambulatory Visit
Admission: RE | Admit: 2018-03-06 | Discharge: 2018-03-06 | Disposition: A | Discharge status: Home / Self Care | Payer: Medicare Other | Source: Ambulatory Visit | Attending: Internal Medicine | Admitting: Internal Medicine

## 2018-03-06 DIAGNOSIS — R1011 Right upper quadrant pain: Secondary | ICD-10-CM | POA: Diagnosis not present

## 2018-05-10 ENCOUNTER — Other Ambulatory Visit: Payer: Self-pay | Admitting: Obstetrics and Gynecology

## 2018-05-10 DIAGNOSIS — Z1231 Encounter for screening mammogram for malignant neoplasm of breast: Secondary | ICD-10-CM

## 2018-06-06 DIAGNOSIS — Z23 Encounter for immunization: Secondary | ICD-10-CM | POA: Diagnosis not present

## 2018-06-12 ENCOUNTER — Ambulatory Visit: Payer: Medicare Other

## 2018-07-18 ENCOUNTER — Encounter: Payer: Self-pay | Admitting: Internal Medicine

## 2018-07-18 ENCOUNTER — Ambulatory Visit (INDEPENDENT_AMBULATORY_CARE_PROVIDER_SITE_OTHER): Payer: Medicare Other | Admitting: Internal Medicine

## 2018-07-18 VITALS — BP 110/80 | HR 71 | Temp 98.2°F | Ht 67.0 in | Wt 203.0 lb

## 2018-07-18 DIAGNOSIS — Z0000 Encounter for general adult medical examination without abnormal findings: Secondary | ICD-10-CM | POA: Diagnosis not present

## 2018-07-18 DIAGNOSIS — Z23 Encounter for immunization: Secondary | ICD-10-CM

## 2018-07-18 DIAGNOSIS — R7301 Impaired fasting glucose: Secondary | ICD-10-CM | POA: Diagnosis not present

## 2018-07-18 DIAGNOSIS — H9192 Unspecified hearing loss, left ear: Secondary | ICD-10-CM

## 2018-07-18 DIAGNOSIS — Z13220 Encounter for screening for lipoid disorders: Secondary | ICD-10-CM

## 2018-07-18 DIAGNOSIS — R5383 Other fatigue: Secondary | ICD-10-CM

## 2018-07-18 MED ORDER — HYDROCORTISONE 2.5 % RE CREA: TOPICAL_CREAM | Freq: Two times a day (BID) | RECTAL | 11 refills | Status: DC | PRN

## 2018-07-18 MED ORDER — TRIAMCINOLONE ACETONIDE 0.1 % EX CREA: 1.0000 "application " | TOPICAL_CREAM | Freq: Two times a day (BID) | CUTANEOUS | 6 refills | Status: AC

## 2018-07-18 NOTE — Patient Instructions (Signed)
The EKG is normal. We will check the labs today.  Health Maintenance, Female Adopting a healthy lifestyle and getting preventive care can go a long way to promote health and wellness. Talk with your health care provider about what schedule of regular examinations is right for you. This is a good chance for you to check in with your provider about disease prevention and staying healthy. In between checkups, there are plenty of things you can do on your own. Experts have done a lot of research about which lifestyle changes and preventive measures are most likely to keep you healthy. Ask your health care provider for more information. Weight and diet Eat a healthy diet  Be sure to include plenty of vegetables, fruits, low-fat dairy products, and lean protein.  Do not eat a lot of foods high in solid fats, added sugars, or salt.  Get regular exercise. This is one of the most important things you can do for your health. ? Most adults should exercise for at least 150 minutes each week. The exercise should increase your heart rate and make you sweat (moderate-intensity exercise). ? Most adults should also do strengthening exercises at least twice a week. This is in addition to the moderate-intensity exercise.  Maintain a healthy weight  Body mass index (BMI) is a measurement that can be used to identify possible weight problems. It estimates body fat based on height and weight. Your health care provider can help determine your BMI and help you achieve or maintain a healthy weight.  For females 61 years of age and older: ? A BMI below 18.5 is considered underweight. ? A BMI of 18.5 to 24.9 is normal. ? A BMI of 25 to 29.9 is considered overweight. ? A BMI of 30 and above is considered obese.  Watch levels of cholesterol and blood lipids  You should start having your blood tested for lipids and cholesterol at 65 years of age, then have this test every 5 years.  You may need to have your  cholesterol levels checked more often if: ? Your lipid or cholesterol levels are high. ? You are older than 65 years of age. ? You are at high risk for heart disease.  Cancer screening Lung Cancer  Lung cancer screening is recommended for adults 7-77 years old who are at high risk for lung cancer because of a history of smoking.  A yearly low-dose CT scan of the lungs is recommended for people who: ? Currently smoke. ? Have quit within the past 15 years. ? Have at least a 30-pack-year history of smoking. A pack year is smoking an average of one pack of cigarettes a day for 1 year.  Yearly screening should continue until it has been 15 years since you quit.  Yearly screening should stop if you develop a health problem that would prevent you from having lung cancer treatment.  Breast Cancer  Practice breast self-awareness. This means understanding how your breasts normally appear and feel.  It also means doing regular breast self-exams. Let your health care provider know about any changes, no matter how small.  If you are in your 20s or 30s, you should have a clinical breast exam (CBE) by a health care provider every 1-3 years as part of a regular health exam.  If you are 27 or older, have a CBE every year. Also consider having a breast X-ray (mammogram) every year.  If you have a family history of breast cancer, talk to your health care  provider about genetic screening.  If you are at high risk for breast cancer, talk to your health care provider about having an MRI and a mammogram every year.  Breast cancer gene (BRCA) assessment is recommended for women who have family members with BRCA-related cancers. BRCA-related cancers include: ? Breast. ? Ovarian. ? Tubal. ? Peritoneal cancers.  Results of the assessment will determine the need for genetic counseling and BRCA1 and BRCA2 testing.  Cervical Cancer Your health care provider may recommend that you be screened regularly  for cancer of the pelvic organs (ovaries, uterus, and vagina). This screening involves a pelvic examination, including checking for microscopic changes to the surface of your cervix (Pap test). You may be encouraged to have this screening done every 3 years, beginning at age 21.  For women ages 30-65, health care providers may recommend pelvic exams and Pap testing every 3 years, or they may recommend the Pap and pelvic exam, combined with testing for human papilloma virus (HPV), every 5 years. Some types of HPV increase your risk of cervical cancer. Testing for HPV may also be done on women of any age with unclear Pap test results.  Other health care providers may not recommend any screening for nonpregnant women who are considered low risk for pelvic cancer and who do not have symptoms. Ask your health care provider if a screening pelvic exam is right for you.  If you have had past treatment for cervical cancer or a condition that could lead to cancer, you need Pap tests and screening for cancer for at least 20 years after your treatment. If Pap tests have been discontinued, your risk factors (such as having a new sexual partner) need to be reassessed to determine if screening should resume. Some women have medical problems that increase the chance of getting cervical cancer. In these cases, your health care provider may recommend more frequent screening and Pap tests.  Colorectal Cancer  This type of cancer can be detected and often prevented.  Routine colorectal cancer screening usually begins at 65 years of age and continues through 65 years of age.  Your health care provider may recommend screening at an earlier age if you have risk factors for colon cancer.  Your health care provider may also recommend using home test kits to check for hidden blood in the stool.  A small camera at the end of a tube can be used to examine your colon directly (sigmoidoscopy or colonoscopy). This is done to  check for the earliest forms of colorectal cancer.  Routine screening usually begins at age 50.  Direct examination of the colon should be repeated every 5-10 years through 65 years of age. However, you may need to be screened more often if early forms of precancerous polyps or small growths are found.  Skin Cancer  Check your skin from head to toe regularly.  Tell your health care provider about any new moles or changes in moles, especially if there is a change in a mole's shape or color.  Also tell your health care provider if you have a mole that is larger than the size of a pencil eraser.  Always use sunscreen. Apply sunscreen liberally and repeatedly throughout the day.  Protect yourself by wearing long sleeves, pants, a wide-brimmed hat, and sunglasses whenever you are outside.  Heart disease, diabetes, and high blood pressure  High blood pressure causes heart disease and increases the risk of stroke. High blood pressure is more likely to develop in: ?   People who have blood pressure in the high end of the normal range (130-139/85-89 mm Hg). ? People who are overweight or obese. ? People who are African American.  If you are 18-39 years of age, have your blood pressure checked every 3-5 years. If you are 40 years of age or older, have your blood pressure checked every year. You should have your blood pressure measured twice-once when you are at a hospital or clinic, and once when you are not at a hospital or clinic. Record the average of the two measurements. To check your blood pressure when you are not at a hospital or clinic, you can use: ? An automated blood pressure machine at a pharmacy. ? A home blood pressure monitor.  If you are between 55 years and 79 years old, ask your health care provider if you should take aspirin to prevent strokes.  Have regular diabetes screenings. This involves taking a blood sample to check your fasting blood sugar level. ? If you are at a  normal weight and have a low risk for diabetes, have this test once every three years after 65 years of age. ? If you are overweight and have a high risk for diabetes, consider being tested at a younger age or more often. Preventing infection Hepatitis B  If you have a higher risk for hepatitis B, you should be screened for this virus. You are considered at high risk for hepatitis B if: ? You were born in a country where hepatitis B is common. Ask your health care provider which countries are considered high risk. ? Your parents were born in a high-risk country, and you have not been immunized against hepatitis B (hepatitis B vaccine). ? You have HIV or AIDS. ? You use needles to inject street drugs. ? You live with someone who has hepatitis B. ? You have had sex with someone who has hepatitis B. ? You get hemodialysis treatment. ? You take certain medicines for conditions, including cancer, organ transplantation, and autoimmune conditions.  Hepatitis C  Blood testing is recommended for: ? Everyone born from 1945 through 1965. ? Anyone with known risk factors for hepatitis C.  Sexually transmitted infections (STIs)  You should be screened for sexually transmitted infections (STIs) including gonorrhea and chlamydia if: ? You are sexually active and are younger than 65 years of age. ? You are older than 65 years of age and your health care provider tells you that you are at risk for this type of infection. ? Your sexual activity has changed since you were last screened and you are at an increased risk for chlamydia or gonorrhea. Ask your health care provider if you are at risk.  If you do not have HIV, but are at risk, it may be recommended that you take a prescription medicine daily to prevent HIV infection. This is called pre-exposure prophylaxis (PrEP). You are considered at risk if: ? You are sexually active and do not regularly use condoms or know the HIV status of your  partner(s). ? You take drugs by injection. ? You are sexually active with a partner who has HIV.  Talk with your health care provider about whether you are at high risk of being infected with HIV. If you choose to begin PrEP, you should first be tested for HIV. You should then be tested every 3 months for as long as you are taking PrEP. Pregnancy  If you are premenopausal and you may become pregnant, ask your health   care provider about preconception counseling.  If you may become pregnant, take 400 to 800 micrograms (mcg) of folic acid every day.  If you want to prevent pregnancy, talk to your health care provider about birth control (contraception). Osteoporosis and menopause  Osteoporosis is a disease in which the bones lose minerals and strength with aging. This can result in serious bone fractures. Your risk for osteoporosis can be identified using a bone density scan.  If you are 65 years of age or older, or if you are at risk for osteoporosis and fractures, ask your health care provider if you should be screened.  Ask your health care provider whether you should take a calcium or vitamin D supplement to lower your risk for osteoporosis.  Menopause may have certain physical symptoms and risks.  Hormone replacement therapy may reduce some of these symptoms and risks. Talk to your health care provider about whether hormone replacement therapy is right for you. Follow these instructions at home:  Schedule regular health, dental, and eye exams.  Stay current with your immunizations.  Do not use any tobacco products including cigarettes, chewing tobacco, or electronic cigarettes.  If you are pregnant, do not drink alcohol.  If you are breastfeeding, limit how much and how often you drink alcohol.  Limit alcohol intake to no more than 1 drink per day for nonpregnant women. One drink equals 12 ounces of beer, 5 ounces of wine, or 1 ounces of hard liquor.  Do not use street  drugs.  Do not share needles.  Ask your health care provider for help if you need support or information about quitting drugs.  Tell your health care provider if you often feel depressed.  Tell your health care provider if you have ever been abused or do not feel safe at home. This information is not intended to replace advice given to you by your health care provider. Make sure you discuss any questions you have with your health care provider. Document Released: 02/06/2011 Document Revised: 12/30/2015 Document Reviewed: 04/27/2015 Elsevier Interactive Patient Education  2018 [employer]  

## 2018-07-18 NOTE — Progress Notes (Signed)
   Subjective:    Patient ID: Sharon Mcgee, female    DOB: Dec 09, 1950, 67 y.o.   MRN: [id number]  HPI Here for welcome to medicare wellness, no new complaints. Please see A/P for status and treatment of chronic medical problems.   Diet: heart healthy Physical activity: sedentary, active at work Depression/mood screen: negative Hearing: intact to whispered voice right, left with moderate loss Visual acuity: grossly normal with lens, performs annual eye exam  ADLs: capable Fall risk: none Home safety: good Cognitive evaluation: intact to orientation, naming, recall and repetition EOL planning: adv directives discussed  I have personally reviewed and have noted 1. The patient's medical and social history - reviewed today no changes 2. Their use of alcohol, tobacco or illicit drugs 3. Their current medications and supplements 4. The patient's functional ability including ADL's, fall risks, home safety risks and hearing or visual impairment. 5. Diet and physical activities 6. Evidence for depression or mood disorders 7. Care team reviewed and updated (available in snapshot)  Review of Systems  Constitutional: Negative.   HENT: Negative.   Eyes: Negative.   Respiratory: Negative for cough, chest tightness and shortness of breath.   Cardiovascular: Negative for chest pain, palpitations and leg swelling.  Gastrointestinal: Negative for abdominal distention, abdominal pain, constipation, diarrhea, nausea and vomiting.  Musculoskeletal: Negative.   Skin: Negative.   Neurological: Negative.   Psychiatric/Behavioral: Negative.       Objective:   Physical Exam Constitutional:      Appearance: She is well-developed.  HENT:     Head: Normocephalic and atraumatic.  Neck:     Musculoskeletal: Normal range of motion.  Cardiovascular:     Rate and Rhythm: Normal rate and regular rhythm.  Pulmonary:     Effort: Pulmonary effort is normal. No respiratory distress.     Breath sounds:  Normal breath sounds. No wheezing or rales.  Abdominal:     General: Bowel sounds are normal. There is no distension.     Palpations: Abdomen is soft.     Tenderness: There is no abdominal tenderness. There is no rebound.  Skin:    General: Skin is warm and dry.  Neurological:     Mental Status: She is alert and oriented to person, place, and time.     Coordination: Coordination normal.    Vitals:   07/18/18 1456  BP: 110/80  Pulse: 71  Temp: 98.2 F (36.8 C)  TempSrc: Oral  SpO2: 97%  Weight: 203 lb (92.1 kg)  Height: 5\' 7"  (1.702 m)   EKG: Rate 61, axis normal, intervals normal, sinus, no st or t wave changes, no change when compared to 2012    Assessment & Plan:  Prevnar 13 given at visit

## 2018-07-19 NOTE — Assessment & Plan Note (Signed)
Stable left ear hearing loss, has hearing aid and it is not helping at this time.

## 2018-07-19 NOTE — Assessment & Plan Note (Signed)
Flu shot up to date. Pneumonia 13 given to start series. Shingrix complete. Tetanus up to date. Colonoscopy up to date. Mammogram up to date, pap smear getting with gyn next week and dexa up to date with gyn. Counseled about sun safety and mole surveillance. Counseled about the dangers of distracted driving. Given 10 year screening recommendations.

## 2018-07-23 ENCOUNTER — Ambulatory Visit
Admission: RE | Admit: 2018-07-23 | Discharge: 2018-07-23 | Disposition: A | Discharge status: Home / Self Care | Payer: Medicare Other | Source: Ambulatory Visit | Attending: Obstetrics and Gynecology | Admitting: Obstetrics and Gynecology

## 2018-07-23 DIAGNOSIS — Z1231 Encounter for screening mammogram for malignant neoplasm of breast: Secondary | ICD-10-CM | POA: Diagnosis not present

## 2018-07-25 ENCOUNTER — Other Ambulatory Visit (INDEPENDENT_AMBULATORY_CARE_PROVIDER_SITE_OTHER): Payer: Medicare Other

## 2018-07-25 DIAGNOSIS — R5383 Other fatigue: Secondary | ICD-10-CM

## 2018-07-25 DIAGNOSIS — Z0000 Encounter for general adult medical examination without abnormal findings: Secondary | ICD-10-CM | POA: Diagnosis not present

## 2018-07-25 DIAGNOSIS — Z13220 Encounter for screening for lipoid disorders: Secondary | ICD-10-CM

## 2018-07-25 DIAGNOSIS — R7301 Impaired fasting glucose: Secondary | ICD-10-CM

## 2018-07-25 LAB — CBC
HCT: 41.5 % (ref 36.0–46.0)
Hemoglobin: 14.2 g/dL (ref 12.0–15.0)
MCHC: 34.1 g/dL (ref 30.0–36.0)
MCV: 94.1 fl (ref 78.0–100.0)
Platelets: 255 10*3/uL (ref 150.0–400.0)
RBC: 4.41 Mil/uL (ref 3.87–5.11)
RDW: 12.7 % (ref 11.5–15.5)
WBC: 4.8 10*3/uL (ref 4.0–10.5)

## 2018-07-25 LAB — COMPREHENSIVE METABOLIC PANEL
ALBUMIN: 4.3 g/dL (ref 3.5–5.2)
ALT: 30 U/L (ref 0–35)
AST: 25 U/L (ref 0–37)
Alkaline Phosphatase: 71 U/L (ref 39–117)
BUN: 11 mg/dL (ref 6–23)
CHLORIDE: 106 meq/L (ref 96–112)
CO2: 28 mEq/L (ref 19–32)
CREATININE: 0.76 mg/dL (ref 0.40–1.20)
Calcium: 9 mg/dL (ref 8.4–10.5)
GFR: 81.03 mL/min (ref >60.00)
Glucose, Bld: 91 mg/dL (ref 70–99)
Potassium: 4.8 mEq/L (ref 3.5–5.1)
Sodium: 142 mEq/L (ref 135–145)
Total Bilirubin: 0.6 mg/dL (ref 0.2–1.2)
Total Protein: 7.3 g/dL (ref 6.0–8.3)

## 2018-07-25 LAB — LIPID PANEL
Cholesterol: 188 mg/dL (ref 0–200)
HDL: 75.7 mg/dL (ref >39.00)
LDL Cholesterol: 87 mg/dL (ref 0–99)
NonHDL: 111.98
Total CHOL/HDL Ratio: 2
Triglycerides: 126 mg/dL (ref 0.0–149.0)
VLDL: 25.2 mg/dL (ref 0.0–40.0)

## 2018-07-25 LAB — HEMOGLOBIN A1C: Hgb A1c MFr Bld: 5.5 % (ref 4.6–6.5)

## 2018-07-25 LAB — TSH: TSH: 2.69 u[IU]/mL (ref 0.35–4.50)

## 2018-08-02 DIAGNOSIS — Z6832 Body mass index (BMI) 32.0-32.9, adult: Secondary | ICD-10-CM | POA: Diagnosis not present

## 2018-08-02 DIAGNOSIS — N958 Other specified menopausal and perimenopausal disorders: Secondary | ICD-10-CM | POA: Diagnosis not present

## 2018-08-02 DIAGNOSIS — M8588 Other specified disorders of bone density and structure, other site: Secondary | ICD-10-CM | POA: Diagnosis not present

## 2018-08-02 DIAGNOSIS — Z01419 Encounter for gynecological examination (general) (routine) without abnormal findings: Secondary | ICD-10-CM | POA: Diagnosis not present

## 2018-08-30 DIAGNOSIS — B353 Tinea pedis: Secondary | ICD-10-CM | POA: Diagnosis not present

## 2019-02-14 DIAGNOSIS — D2261 Melanocytic nevi of right upper limb, including shoulder: Secondary | ICD-10-CM | POA: Diagnosis not present

## 2019-02-14 DIAGNOSIS — D224 Melanocytic nevi of scalp and neck: Secondary | ICD-10-CM | POA: Diagnosis not present

## 2019-02-14 DIAGNOSIS — D1801 Hemangioma of skin and subcutaneous tissue: Secondary | ICD-10-CM | POA: Diagnosis not present

## 2019-02-14 DIAGNOSIS — D220 Melanocytic nevi of lip: Secondary | ICD-10-CM | POA: Diagnosis not present

## 2019-02-14 DIAGNOSIS — L218 Other seborrheic dermatitis: Secondary | ICD-10-CM | POA: Diagnosis not present

## 2019-02-14 DIAGNOSIS — D225 Melanocytic nevi of trunk: Secondary | ICD-10-CM | POA: Diagnosis not present

## 2019-02-14 DIAGNOSIS — D2371 Other benign neoplasm of skin of right lower limb, including hip: Secondary | ICD-10-CM | POA: Diagnosis not present

## 2019-07-05 DIAGNOSIS — Z03818 Encounter for observation for suspected exposure to other biological agents ruled out: Secondary | ICD-10-CM | POA: Diagnosis not present

## 2019-07-14 ENCOUNTER — Other Ambulatory Visit: Payer: Self-pay | Admitting: Obstetrics and Gynecology

## 2019-07-14 DIAGNOSIS — Z1231 Encounter for screening mammogram for malignant neoplasm of breast: Secondary | ICD-10-CM

## 2019-07-21 ENCOUNTER — Other Ambulatory Visit (INDEPENDENT_AMBULATORY_CARE_PROVIDER_SITE_OTHER): Payer: Medicare Other

## 2019-07-21 ENCOUNTER — Other Ambulatory Visit: Payer: Self-pay

## 2019-07-21 ENCOUNTER — Encounter: Payer: Self-pay | Admitting: Internal Medicine

## 2019-07-21 ENCOUNTER — Ambulatory Visit (INDEPENDENT_AMBULATORY_CARE_PROVIDER_SITE_OTHER): Payer: Medicare Other | Admitting: Internal Medicine

## 2019-07-21 VITALS — BP 110/80 | HR 66 | Temp 98.0°F | Ht 67.0 in | Wt 207.0 lb

## 2019-07-21 DIAGNOSIS — L659 Nonscarring hair loss, unspecified: Secondary | ICD-10-CM | POA: Diagnosis not present

## 2019-07-21 DIAGNOSIS — E559 Vitamin D deficiency, unspecified: Secondary | ICD-10-CM

## 2019-07-21 DIAGNOSIS — Z13220 Encounter for screening for lipoid disorders: Secondary | ICD-10-CM

## 2019-07-21 DIAGNOSIS — E538 Deficiency of other specified B group vitamins: Secondary | ICD-10-CM | POA: Diagnosis not present

## 2019-07-21 DIAGNOSIS — H9192 Unspecified hearing loss, left ear: Secondary | ICD-10-CM | POA: Diagnosis not present

## 2019-07-21 DIAGNOSIS — Z23 Encounter for immunization: Secondary | ICD-10-CM | POA: Diagnosis not present

## 2019-07-21 DIAGNOSIS — Z0000 Encounter for general adult medical examination without abnormal findings: Secondary | ICD-10-CM

## 2019-07-21 DIAGNOSIS — Z0001 Encounter for general adult medical examination with abnormal findings: Secondary | ICD-10-CM | POA: Diagnosis not present

## 2019-07-21 DIAGNOSIS — R7301 Impaired fasting glucose: Secondary | ICD-10-CM

## 2019-07-21 DIAGNOSIS — M8580 Other specified disorders of bone density and structure, unspecified site: Secondary | ICD-10-CM | POA: Diagnosis not present

## 2019-07-21 LAB — VITAMIN D 25 HYDROXY (VIT D DEFICIENCY, FRACTURES): VITD: 33.93 ng/mL (ref 30.00–100.00)

## 2019-07-21 LAB — CBC
HCT: 38.8 % (ref 36.0–46.0)
Hemoglobin: 12.8 g/dL (ref 12.0–15.0)
MCHC: 33.1 g/dL (ref 30.0–36.0)
MCV: 95.5 fl (ref 78.0–100.0)
Platelets: 239 10*3/uL (ref 150.0–400.0)
RBC: 4.06 Mil/uL (ref 3.87–5.11)
RDW: 13.2 % (ref 11.5–15.5)
WBC: 5.6 10*3/uL (ref 4.0–10.5)

## 2019-07-21 LAB — COMPREHENSIVE METABOLIC PANEL
ALT: 30 U/L (ref 0–35)
AST: 23 U/L (ref 0–37)
Albumin: 4.1 g/dL (ref 3.5–5.2)
Alkaline Phosphatase: 63 U/L (ref 39–117)
BUN: 15 mg/dL (ref 6–23)
CO2: 24 mEq/L (ref 19–32)
Calcium: 8.6 mg/dL (ref 8.4–10.5)
Chloride: 106 mEq/L (ref 96–112)
Creatinine, Ser: 0.7 mg/dL (ref 0.40–1.20)
GFR: 83.57 mL/min (ref >60.00)
Glucose, Bld: 93 mg/dL (ref 70–99)
Potassium: 3.9 mEq/L (ref 3.5–5.1)
Sodium: 138 mEq/L (ref 135–145)
Total Bilirubin: 0.5 mg/dL (ref 0.2–1.2)
Total Protein: 6.8 g/dL (ref 6.0–8.3)

## 2019-07-21 LAB — HEMOGLOBIN A1C: Hgb A1c MFr Bld: 5.4 % (ref 4.6–6.5)

## 2019-07-21 LAB — LIPID PANEL
Cholesterol: 206 mg/dL — ABNORMAL HIGH (ref 0–200)
HDL: 70.5 mg/dL (ref >39.00)
LDL Cholesterol: 116 mg/dL — ABNORMAL HIGH (ref 0–99)
NonHDL: 135.08
Total CHOL/HDL Ratio: 3
Triglycerides: 96 mg/dL (ref 0.0–149.0)
VLDL: 19.2 mg/dL (ref 0.0–40.0)

## 2019-07-21 LAB — TSH: TSH: 1.19 u[IU]/mL (ref 0.35–4.50)

## 2019-07-21 LAB — VITAMIN B12: Vitamin B-12: 415 pg/mL (ref 211–911)

## 2019-07-21 NOTE — Progress Notes (Signed)
Subjective:   Patient ID: Sharon Mcgee, female    DOB: [birth year], 68 y.o.   MRN: [id number]  HPI Here for initial medicare wellness visit, no new complaints. Please see A/P for status and treatment of chronic medical problems.   HPI #2: Here for follow up of osteopenia (last DEXA at ob/gyn and stable, this was done last year, walking 3 times per week starting in the last 6 months, takes calcium and vitamin D over the counter, denies falls or fractures) and hair thinning (having hair thinning, mild fatigue, denies heat or cold intolerance, denies fevers or chills)  Diet: heart healthy Physical activity: sedentary Depression/mood screen: negative Hearing: mild to moderate loss left, right normal Visual acuity: grossly normal with lens, performs annual eye exam  ADLs: capable Fall risk: none Home safety: good Cognitive evaluation: intact to orientation, naming, recall and repetition EOL planning: adv directives discussed    Office Visit from 07/21/2019 in [place]  PHQ-2 Total Score  0      I have personally reviewed and have noted 1. The patient's medical and social history - reviewed today no changes 2. Their use of alcohol, tobacco or illicit drugs 3. Their current medications and supplements 4. The patient's functional ability including ADL's, fall risks, home safety risks and hearing or visual impairment. 5. Diet and physical activities 6. Evidence for depression or mood disorders 7. Care team reviewed and updated  Patient Care Team: Hoyt Koch, MD as PCP - General (Internal Medicine) Past Medical History:  Diagnosis Date  . Abdominal pain, RUQ   . GERD (gastroesophageal reflux disease)   . Hearing loss   . Osteopenia   . Tobacco use disorder    Past Surgical History:  Procedure Laterality Date  . laproscopy     fertility work up  . uterine polypectomy     Family History  Problem Relation Age of Onset  . Melanoma  Father   . Colon cancer Maternal Grandfather   . Esophageal cancer Neg Hx   . Liver cancer Neg Hx   . Pancreatic cancer Neg Hx   . Rectal cancer Neg Hx   . Stomach cancer Neg Hx     Review of Systems  Constitutional: Negative.   HENT: Negative.   Eyes: Negative.   Respiratory: Negative for cough, chest tightness and shortness of breath.   Cardiovascular: Negative for chest pain, palpitations and leg swelling.  Gastrointestinal: Negative for abdominal distention, abdominal pain, constipation, diarrhea, nausea and vomiting.  Endocrine:       Hair thinning  Musculoskeletal: Negative.   Skin: Negative.   Neurological: Negative.   Psychiatric/Behavioral: Negative.     Objective:  Physical Exam Constitutional:      Appearance: She is well-developed.  HENT:     Head: Normocephalic and atraumatic.  Cardiovascular:     Rate and Rhythm: Normal rate and regular rhythm.  Pulmonary:     Effort: Pulmonary effort is normal. No respiratory distress.     Breath sounds: Normal breath sounds. No wheezing or rales.  Abdominal:     General: Bowel sounds are normal. There is no distension.     Palpations: Abdomen is soft.     Tenderness: There is no abdominal tenderness. There is no rebound.  Musculoskeletal:     Cervical back: Normal range of motion.  Skin:    General: Skin is warm and dry.  Neurological:     Mental Status: She is alert and oriented to  person, place, and time.     Coordination: Coordination normal.     Vitals:   07/21/19 1251  BP: 110/80  Pulse: 66  Temp: 98 F (36.7 C)  TempSrc: Oral  SpO2: 98%  Weight: 207 lb (93.9 kg)  Height: 5\' 7"  (1.702 m)    This visit occurred during the SARS-CoV-2 public health emergency.  Safety protocols were in place, including screening questions prior to the visit, additional usage of staff PPE, and extensive cleaning of exam room while observing appropriate contact time as indicated for disinfecting solutions.   Assessment &  Plan:  Pneumonia 23 given at visit

## 2019-07-21 NOTE — Patient Instructions (Signed)
Health Maintenance, Female Adopting a healthy lifestyle and getting preventive care are important in promoting health and wellness. Ask your health care provider about:  The right schedule for you to have regular tests and exams.  Things you can do on your own to prevent diseases and keep yourself healthy. What should I know about diet, weight, and exercise? Eat a healthy diet   Eat a diet that includes plenty of vegetables, fruits, low-fat dairy products, and lean protein.  Do not eat a lot of foods that are high in solid fats, added sugars, or sodium. Maintain a healthy weight Body mass index (BMI) is used to identify weight problems. It estimates body fat based on height and weight. Your health care provider can help determine your BMI and help you achieve or maintain a healthy weight. Get regular exercise Get regular exercise. This is one of the most important things you can do for your health. Most adults should:  Exercise for at least 150 minutes each week. The exercise should increase your heart rate and make you sweat (moderate-intensity exercise).  Do strengthening exercises at least twice a week. This is in addition to the moderate-intensity exercise.  Spend less time sitting. Even light physical activity can be beneficial. Watch cholesterol and blood lipids Have your blood tested for lipids and cholesterol at 66 years of age, then have this test every 5 years. Have your cholesterol levels checked more often if:  Your lipid or cholesterol levels are high.  You are older than 66 years of age.  You are at high risk for heart disease. What should I know about cancer screening? Depending on your health history and family history, you may need to have cancer screening at various ages. This may include screening for:  Breast cancer.  Cervical cancer.  Colorectal cancer.  Skin cancer.  Lung cancer. What should I know about heart disease, diabetes, and high blood  pressure? Blood pressure and heart disease  High blood pressure causes heart disease and increases the risk of stroke. This is more likely to develop in people who have high blood pressure readings, are of African descent, or are overweight.  Have your blood pressure checked: ? Every 3-5 years if you are 18-39 years of age. ? Every year if you are 40 years old or older. Diabetes Have regular diabetes screenings. This checks your fasting blood sugar level. Have the screening done:  Once every three years after age 40 if you are at a normal weight and have a low risk for diabetes.  More often and at a younger age if you are overweight or have a high risk for diabetes. What should I know about preventing infection? Hepatitis B If you have a higher risk for hepatitis B, you should be screened for this virus. Talk with your health care provider to find out if you are at risk for hepatitis B infection. Hepatitis C Testing is recommended for:  Everyone born from 1945 through 1965.  Anyone with known risk factors for hepatitis C. Sexually transmitted infections (STIs)  Get screened for STIs, including gonorrhea and chlamydia, if: ? You are sexually active and are younger than 66 years of age. ? You are older than 66 years of age and your health care provider tells you that you are at risk for this type of infection. ? Your sexual activity has changed since you were last screened, and you are at increased risk for chlamydia or gonorrhea. Ask your health care provider if   you are at risk.  Ask your health care provider about whether you are at high risk for HIV. Your health care provider may recommend a prescription medicine to help prevent HIV infection. If you choose to take medicine to prevent HIV, you should first get tested for HIV. You should then be tested every 3 months for as long as you are taking the medicine. Pregnancy  If you are about to stop having your period (premenopausal) and  you may become pregnant, seek counseling before you get pregnant.  Take 400 to 800 micrograms (mcg) of folic acid every day if you become pregnant.  Ask for birth control (contraception) if you want to prevent pregnancy. Osteoporosis and menopause Osteoporosis is a disease in which the bones lose minerals and strength with aging. This can result in bone fractures. If you are 65 years old or older, or if you are at risk for osteoporosis and fractures, ask your health care provider if you should:  Be screened for bone loss.  Take a calcium or vitamin D supplement to lower your risk of fractures.  Be given hormone replacement therapy (HRT) to treat symptoms of menopause. Follow these instructions at home: Lifestyle  Do not use any products that contain nicotine or tobacco, such as cigarettes, e-cigarettes, and chewing tobacco. If you need help quitting, ask your health care provider.  Do not use street drugs.  Do not share needles.  Ask your health care provider for help if you need support or information about quitting drugs. Alcohol use  Do not drink alcohol if: ? Your health care provider tells you not to drink. ? You are pregnant, may be pregnant, or are planning to become pregnant.  If you drink alcohol: ? Limit how much you use to 0-1 drink a day. ? Limit intake if you are breastfeeding.  Be aware of how much alcohol is in your drink. In the [place] one drink equals one 12 oz bottle of beer (355 mL), one 5 oz glass of wine (148 mL), or one 1 oz glass of hard liquor (44 mL). General instructions  Schedule regular health, dental, and eye exams.  Stay current with your vaccines.  Tell your health care provider if: ? You often feel depressed. ? You have ever been abused or do not feel safe at home. Summary  Adopting a healthy lifestyle and getting preventive care are important in promoting health and wellness.  Follow your health care provider's instructions about healthy  diet, exercising, and getting tested or screened for diseases.  Follow your health care provider's instructions on monitoring your cholesterol and blood pressure. This information is not intended to replace advice given to you by your health care provider. Make sure you discuss any questions you have with your health care provider. Document Released: 02/06/2011 Document Revised: 07/17/2018 Document Reviewed: 07/17/2018 Elsevier Patient Education  2020 [employer]  

## 2019-07-22 DIAGNOSIS — L659 Nonscarring hair loss, unspecified: Secondary | ICD-10-CM | POA: Insufficient documentation

## 2019-07-22 NOTE — Assessment & Plan Note (Signed)
Flu shot up to date. Pneumonia 23 given to complete series. Shingrix complete. Tetanus due 2022. Colonoscopy due 2029. Mammogram due 2021, pap smear aged out and dexa due 2021. Counseled about sun safety and mole surveillance. Counseled about the dangers of distracted driving. Given 10 year screening recommendations.

## 2019-07-22 NOTE — Assessment & Plan Note (Signed)
Checking thyroid, vitamin D and B12, CBC, CMP, HgA1c. Adjust as needed.

## 2019-07-22 NOTE — Assessment & Plan Note (Signed)
DEXA due 2021 and she will get. Taking calcium and vitamin D. Checking vitamin D level today. Reminded to continue weight bearing exercise.

## 2019-07-22 NOTE — Assessment & Plan Note (Signed)
Stable

## 2019-09-05 ENCOUNTER — Ambulatory Visit
Admission: RE | Admit: 2019-09-05 | Discharge: 2019-09-05 | Disposition: A | Discharge status: Home / Self Care | Payer: Medicare Other | Source: Ambulatory Visit | Attending: Obstetrics and Gynecology | Admitting: Obstetrics and Gynecology

## 2019-09-05 ENCOUNTER — Other Ambulatory Visit: Payer: Self-pay

## 2019-09-05 DIAGNOSIS — Z1231 Encounter for screening mammogram for malignant neoplasm of breast: Secondary | ICD-10-CM

## 2019-09-26 DIAGNOSIS — Z124 Encounter for screening for malignant neoplasm of cervix: Secondary | ICD-10-CM | POA: Diagnosis not present

## 2019-09-26 DIAGNOSIS — Z6832 Body mass index (BMI) 32.0-32.9, adult: Secondary | ICD-10-CM | POA: Diagnosis not present

## 2019-09-26 DIAGNOSIS — L209 Atopic dermatitis, unspecified: Secondary | ICD-10-CM | POA: Diagnosis not present

## 2019-12-02 ENCOUNTER — Other Ambulatory Visit: Payer: Self-pay | Admitting: Internal Medicine

## 2019-12-02 NOTE — Telephone Encounter (Signed)
    1.Medication Requested:hydrocortisone (PROCTOZONE-HC) 2.5 % rectal cream  2. Pharmacy (Name, Street, [place]): [facility] [address], [place]  3. On Med List: yes  4. Last Visit with PCP: 07/2019 5. Next visit date with PCP: 07/22/20   Agent: Please be advised that RX refills may take up to 3 business days. We ask that you follow-up with your pharmacy.

## 2019-12-03 NOTE — Telephone Encounter (Signed)
Patient requested refill on Hydrocortisone Rectal Cream 2.5 % to be sent to [facility]

## 2019-12-04 ENCOUNTER — Other Ambulatory Visit: Payer: Self-pay

## 2019-12-04 MED ORDER — HYDROCORTISONE (PERIANAL) 2.5 % EX CREA: 1.0000 "application " | TOPICAL_CREAM | Freq: Two times a day (BID) | CUTANEOUS | 11 refills | Status: DC

## 2019-12-04 MED FILL — PROCTOZONE-HC 2.5 % CREA: 2.5 | 14 days supply | Qty: 30 | Fill #0

## 2019-12-04 NOTE — Telephone Encounter (Signed)
Unable to submit for refill due to prescription being expired.    A new script will have to be written and submitted

## 2019-12-04 NOTE — Telephone Encounter (Signed)
Fine to refill 

## 2019-12-04 NOTE — Telephone Encounter (Signed)
Sent in, thanks.  

## 2019-12-09 IMAGING — MG DIGITAL SCREENING BILATERAL MAMMOGRAM WITH TOMO AND CAD
8 series · 8 of 24 positions shown · non-contrast
Comparison: Previous exam(s).

CLINICAL DATA: Screening.

EXAM:
DIGITAL SCREENING BILATERAL MAMMOGRAM WITH TOMO AND CAD

[R MLO synth-2D]
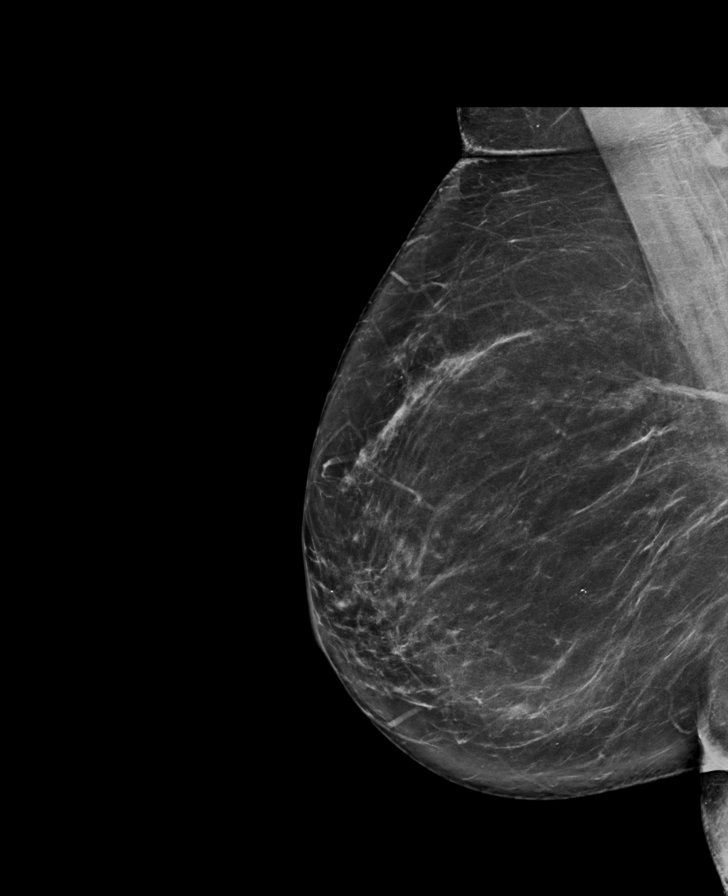

[L MLO synth-2D]
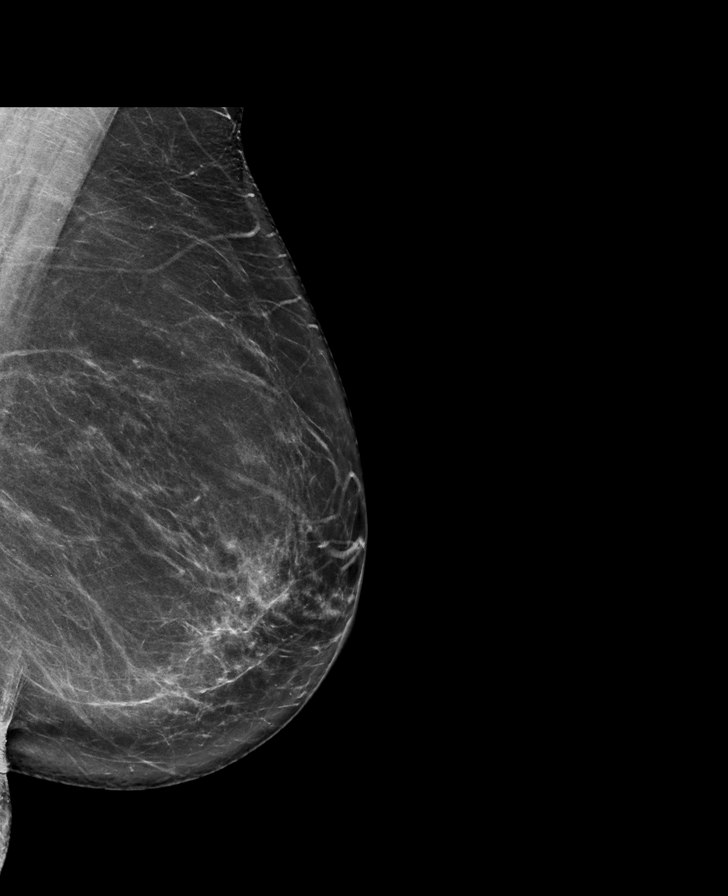

[L CC synth-2D]
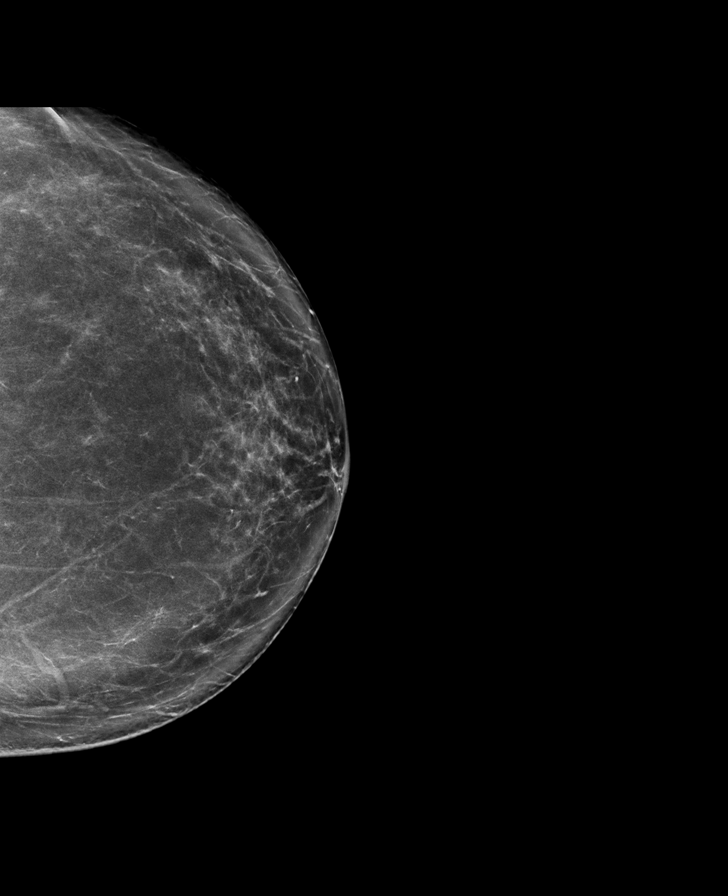

[R CC synth-2D]
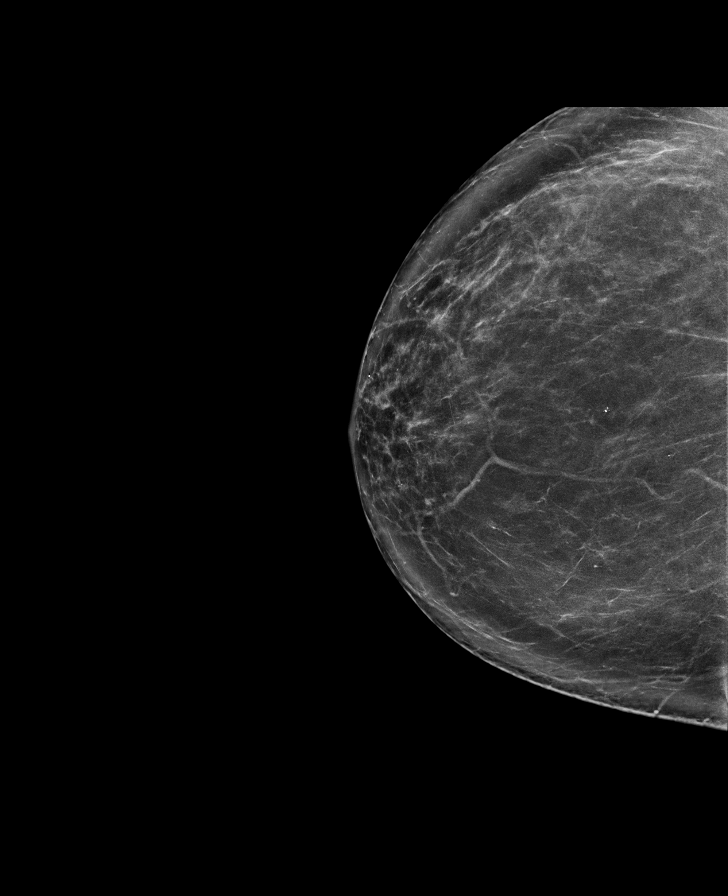

[L MLO tomo · tomo slice 44/87.0]
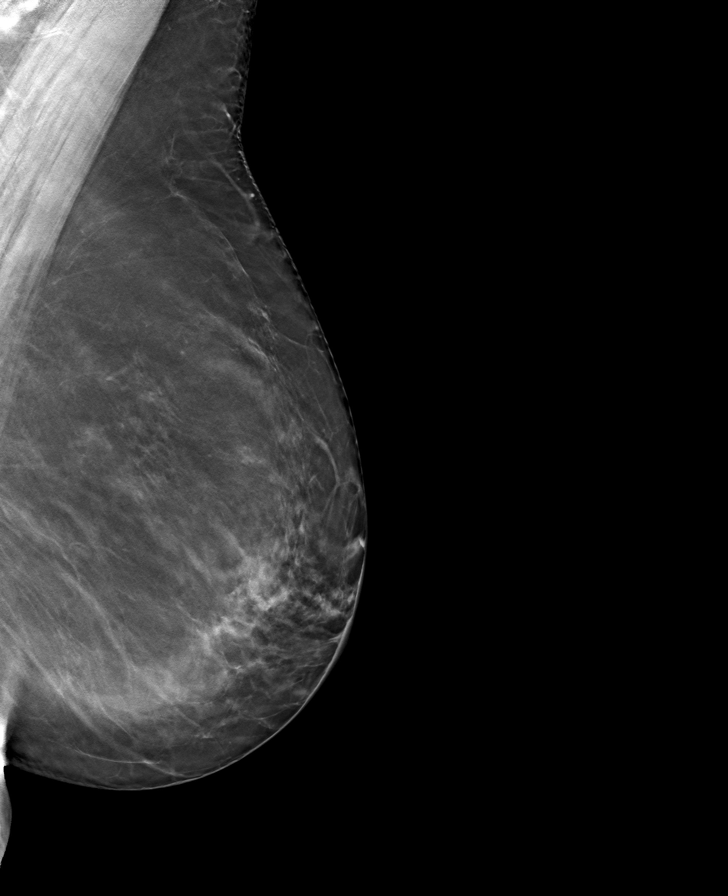

[R MLO tomo · tomo slice 45/88.0]
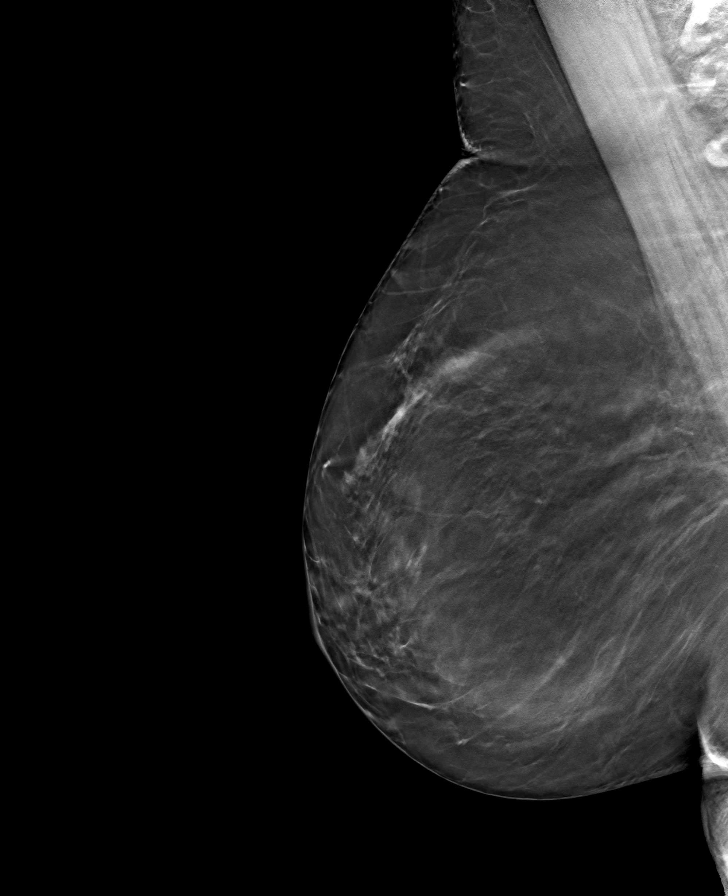

[R CC tomo · tomo slice 45/88.0]
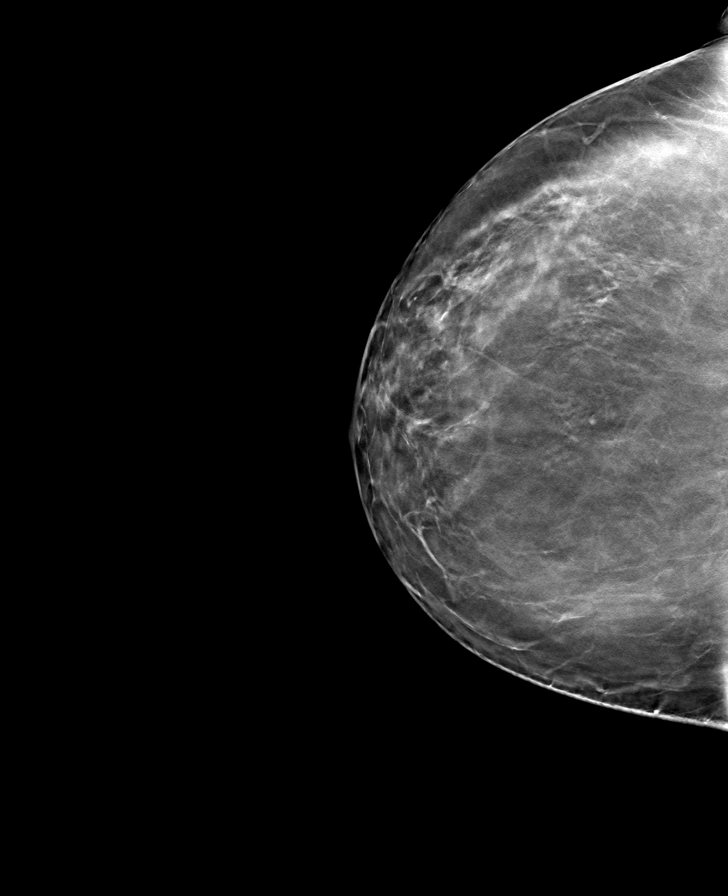

[L CC tomo · tomo slice 43/86.0]
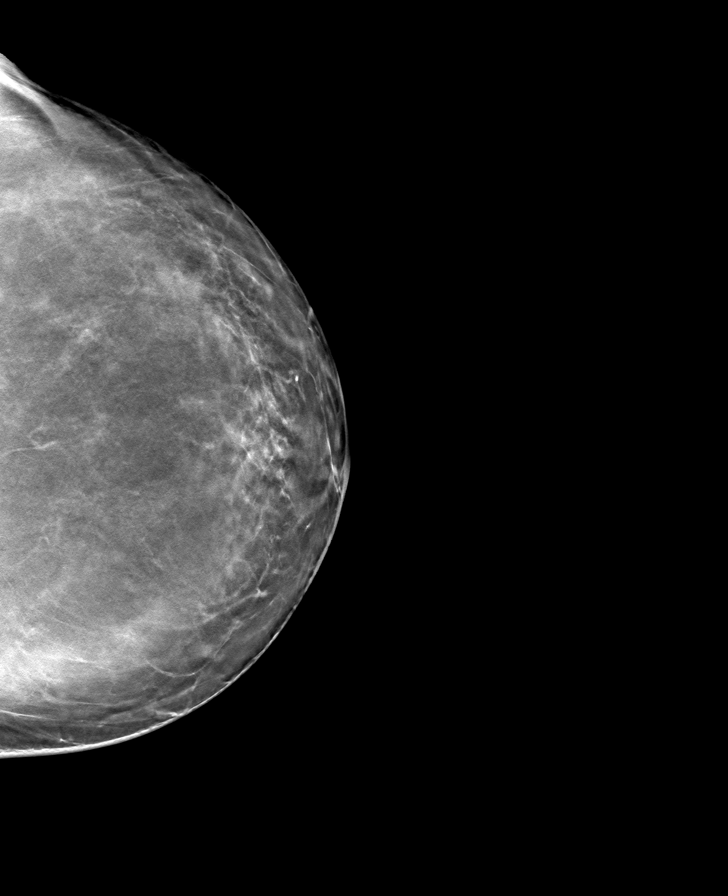

[8 of 24 positions shown; findings below may reference images not displayed]

ACR Breast Density Category b: There are scattered areas of
fibroglandular density.
FINDINGS: There are no findings suspicious for malignancy. Images were
processed with CAD.
IMPRESSION: No mammographic evidence of malignancy. A result letter of this
screening mammogram will be mailed directly to the patient.

RECOMMENDATION:
Screening mammogram in one year. (Code:[id number])

BI-RADS CATEGORY  1: Negative.

## 2019-12-12 ENCOUNTER — Other Ambulatory Visit: Payer: Self-pay

## 2019-12-12 MED ORDER — HYDROCORTISONE (PERIANAL) 2.5 % EX CREA: 1.0000 "application " | TOPICAL_CREAM | Freq: Two times a day (BID) | CUTANEOUS | 11 refills | Status: DC

## 2019-12-12 NOTE — Telephone Encounter (Signed)
Resent [facility] to [facility] [place].Marland KitchenJohny Mcgee

## 2019-12-12 NOTE — Telephone Encounter (Signed)
Prescription resubmitted to [facility]

## 2019-12-12 NOTE — Addendum Note (Signed)
Addended by: Earnstine Regal on: 12/12/2019 01:52 PM   Modules accepted: Orders

## 2019-12-12 NOTE — Telephone Encounter (Signed)
    [facility] to [id number] [place], [place] - [address] for hydrocortisone (ANUSOL-HC) 2.5 % rectal cream

## 2020-04-02 DIAGNOSIS — D220 Melanocytic nevi of lip: Secondary | ICD-10-CM | POA: Diagnosis not present

## 2020-04-02 DIAGNOSIS — D1801 Hemangioma of skin and subcutaneous tissue: Secondary | ICD-10-CM | POA: Diagnosis not present

## 2020-04-02 DIAGNOSIS — D225 Melanocytic nevi of trunk: Secondary | ICD-10-CM | POA: Diagnosis not present

## 2020-04-02 DIAGNOSIS — L814 Other melanin hyperpigmentation: Secondary | ICD-10-CM | POA: Diagnosis not present

## 2020-04-02 DIAGNOSIS — L2089 Other atopic dermatitis: Secondary | ICD-10-CM | POA: Diagnosis not present

## 2020-04-02 DIAGNOSIS — D2262 Melanocytic nevi of left upper limb, including shoulder: Secondary | ICD-10-CM | POA: Diagnosis not present

## 2020-04-02 DIAGNOSIS — L821 Other seborrheic keratosis: Secondary | ICD-10-CM | POA: Diagnosis not present

## 2020-04-02 DIAGNOSIS — D2239 Melanocytic nevi of other parts of face: Secondary | ICD-10-CM | POA: Diagnosis not present

## 2020-06-17 DIAGNOSIS — I10 Essential (primary) hypertension: Secondary | ICD-10-CM | POA: Diagnosis not present

## 2020-06-17 DIAGNOSIS — R52 Pain, unspecified: Secondary | ICD-10-CM | POA: Diagnosis not present

## 2020-06-17 DIAGNOSIS — G4489 Other headache syndrome: Secondary | ICD-10-CM | POA: Diagnosis not present

## 2020-06-17 DIAGNOSIS — R202 Paresthesia of skin: Secondary | ICD-10-CM | POA: Diagnosis not present

## 2020-06-18 ENCOUNTER — Telehealth: Payer: Self-pay | Admitting: Internal Medicine

## 2020-06-18 NOTE — Telephone Encounter (Signed)
  Patient calling to make Dr Sharlet [place] aware on 11/11 she had an episode of elevated blood pressure. The readings were 155/99, 180/113, 160/101 with blurred vision and tingling. EMS was called, patient did not go hospital. Patient states she may have been "exhausted"  Patient states she feels much better. Blood pressure today 133/88, 127/78. She complains of a slight headache. No other symptoms. Appointment scheduled for 11/19

## 2020-06-25 ENCOUNTER — Encounter: Payer: Self-pay | Admitting: Internal Medicine

## 2020-06-25 ENCOUNTER — Other Ambulatory Visit: Payer: Self-pay

## 2020-06-25 ENCOUNTER — Ambulatory Visit (INDEPENDENT_AMBULATORY_CARE_PROVIDER_SITE_OTHER): Payer: Medicare Other | Admitting: Internal Medicine

## 2020-06-25 VITALS — BP 120/84 | HR 72 | Temp 98.2°F | Ht 67.0 in | Wt 206.0 lb

## 2020-06-25 DIAGNOSIS — G459 Transient cerebral ischemic attack, unspecified: Secondary | ICD-10-CM | POA: Insufficient documentation

## 2020-06-25 DIAGNOSIS — R030 Elevated blood-pressure reading, without diagnosis of hypertension: Secondary | ICD-10-CM | POA: Diagnosis not present

## 2020-06-25 NOTE — Assessment & Plan Note (Addendum)
This is possible diagnosis versus hypertensive urgency. Ordered MRI brain to assess. Advised if recurrent symptoms to go to ER.

## 2020-06-25 NOTE — Progress Notes (Signed)
Subjective:   Patient ID: Sharon Mcgee, female    DOB: 03/03/1951, 69 y.o.   MRN: [id number]  HPI The patient is a 69 YO female coming in for concerns about recent elevated blood pressure. She was at home and started feeling funny. She was having some headache, chest pressure and tingling right side of her face. Took BP and it was 155/98, then 160/101. She laid down and relaxed and gradually symptoms improved. They called EMS and they monitored her for about 30 minutes and took EKG immediately on arrival which was normal. The tingling resolved during that time as well as chest pressure. Her blood pressure was down to normal before they left so they did not take her to hospital. She denies change in diet or exercise that day. She did work that day and is working only 1 day per week. This was 1 week ago. Since that time she did take it easy over the weekend and made sure to drink plenty of fluids. Denies recurrence. Was able to walk for 50 minutes without recurrence of symptoms. Then was at work earlier this week and had recurrence of feeling weird with headache. Denies numbness during that episode. BP was in 150s/90s which is unusual for her. In between episodes her BP has been 120-130s/70-80s. She has never been on BP medication in the past. Denies chest pains but has had some pressure she felt was GERD in the past several weeks. Random and not with exercise. Not taking anything for GERD.   PMH, [facility], social history reviewed and updated  Review of Systems  Constitutional: Positive for appetite change.  HENT: Negative.   Eyes: Negative.   Respiratory: Negative for cough, chest tightness and shortness of breath.   Cardiovascular: Positive for chest pain. Negative for palpitations and leg swelling.  Gastrointestinal: Negative for abdominal distention, abdominal pain, constipation, diarrhea, nausea and vomiting.  Musculoskeletal: Negative.   Skin: Negative.   Neurological: Positive for  numbness.  Psychiatric/Behavioral: Negative.     Objective:  Physical Exam Constitutional:      Appearance: She is well-developed.  HENT:     Head: Normocephalic and atraumatic.  Cardiovascular:     Rate and Rhythm: Normal rate and regular rhythm.  Pulmonary:     Effort: Pulmonary effort is normal. No respiratory distress.     Breath sounds: Normal breath sounds. No wheezing or rales.  Abdominal:     General: Bowel sounds are normal. There is no distension.     Palpations: Abdomen is soft.     Tenderness: There is no abdominal tenderness. There is no rebound.  Musculoskeletal:     Cervical back: Normal range of motion.  Skin:    General: Skin is warm and dry.  Neurological:     Mental Status: She is alert and oriented to person, place, and time.     Coordination: Coordination normal.     Vitals:   06/25/20 1031  BP: 120/84  Pulse: 72  Temp: 98.2 F (36.8 C)  TempSrc: Oral  SpO2: 97%  Weight: 206 lb (93.4 kg)  Height: 5\' 7"  (1.702 m)   EKG: Rate 52, axis normal, intervals normal, sinus brady, no st or t wave changes, no significant change from 2019  EKG from EMS reviewed and consistent with one taken in office and her baseline from 2019  This visit occurred during the SARS-CoV-2 public health emergency.  Safety protocols were in place, including screening questions prior to the visit, additional usage of staff  PPE, and extensive cleaning of exam room while observing appropriate contact time as indicated for disinfecting solutions.   Assessment & Plan:  Visit time 25 minutes in face to face communication with patient and coordination of care, additional 7 minutes spent in record review, coordination or care, ordering tests, communicating/referring to other healthcare professionals, documenting in medical records all on the same day of the visit for total time 32 minutes spent on the visit.

## 2020-06-25 NOTE — Assessment & Plan Note (Signed)
EKG done in office consistent with prior and EMS reading. We elect to defer starting medicine today until stress testing can be done. Ordered stress test and MRI. No explanation for elevated readings. Advised if she gets numbness with elevated BP again to go to ER.

## 2020-06-25 NOTE — Patient Instructions (Signed)
We will get the stress test and the EKG was normal today. We will get the MRI.

## 2020-07-05 DIAGNOSIS — H524 Presbyopia: Secondary | ICD-10-CM | POA: Diagnosis not present

## 2020-07-05 DIAGNOSIS — H2513 Age-related nuclear cataract, bilateral: Secondary | ICD-10-CM | POA: Diagnosis not present

## 2020-07-05 DIAGNOSIS — H5203 Hypermetropia, bilateral: Secondary | ICD-10-CM | POA: Diagnosis not present

## 2020-07-05 DIAGNOSIS — H52222 Regular astigmatism, left eye: Secondary | ICD-10-CM | POA: Diagnosis not present

## 2020-07-05 DIAGNOSIS — H43811 Vitreous degeneration, right eye: Secondary | ICD-10-CM | POA: Diagnosis not present

## 2020-07-07 ENCOUNTER — Encounter: Payer: Self-pay | Admitting: Internal Medicine

## 2020-07-09 NOTE — Telephone Encounter (Signed)
Patient calling back and checking on the status of this, she goes on Monday for her MRI

## 2020-07-12 ENCOUNTER — Ambulatory Visit
Admission: RE | Admit: 2020-07-12 | Discharge: 2020-07-12 | Disposition: A | Discharge status: Home / Self Care | Payer: Medicare Other | Source: Ambulatory Visit | Attending: Internal Medicine | Admitting: Internal Medicine

## 2020-07-12 DIAGNOSIS — R030 Elevated blood-pressure reading, without diagnosis of hypertension: Secondary | ICD-10-CM | POA: Diagnosis not present

## 2020-07-12 DIAGNOSIS — J3489 Other specified disorders of nose and nasal sinuses: Secondary | ICD-10-CM | POA: Diagnosis not present

## 2020-07-12 DIAGNOSIS — G459 Transient cerebral ischemic attack, unspecified: Secondary | ICD-10-CM

## 2020-07-12 DIAGNOSIS — R202 Paresthesia of skin: Secondary | ICD-10-CM | POA: Diagnosis not present

## 2020-07-12 DIAGNOSIS — I6782 Cerebral ischemia: Secondary | ICD-10-CM | POA: Diagnosis not present

## 2020-07-22 ENCOUNTER — Ambulatory Visit: Payer: Medicare Other | Admitting: Internal Medicine

## 2020-07-23 ENCOUNTER — Ambulatory Visit (INDEPENDENT_AMBULATORY_CARE_PROVIDER_SITE_OTHER): Payer: Medicare Other | Admitting: Internal Medicine

## 2020-07-23 ENCOUNTER — Encounter: Payer: Self-pay | Admitting: Internal Medicine

## 2020-07-23 ENCOUNTER — Other Ambulatory Visit: Payer: Self-pay

## 2020-07-23 VITALS — BP 118/72 | HR 68 | Temp 98.5°F | Ht 67.0 in | Wt 203.6 lb

## 2020-07-23 DIAGNOSIS — M8580 Other specified disorders of bone density and structure, unspecified site: Secondary | ICD-10-CM

## 2020-07-23 DIAGNOSIS — E559 Vitamin D deficiency, unspecified: Secondary | ICD-10-CM

## 2020-07-23 DIAGNOSIS — Z23 Encounter for immunization: Secondary | ICD-10-CM

## 2020-07-23 DIAGNOSIS — H9192 Unspecified hearing loss, left ear: Secondary | ICD-10-CM

## 2020-07-23 DIAGNOSIS — R7301 Impaired fasting glucose: Secondary | ICD-10-CM

## 2020-07-23 DIAGNOSIS — Z0000 Encounter for general adult medical examination without abnormal findings: Secondary | ICD-10-CM | POA: Diagnosis not present

## 2020-07-23 DIAGNOSIS — Z13220 Encounter for screening for lipoid disorders: Secondary | ICD-10-CM | POA: Diagnosis not present

## 2020-07-23 DIAGNOSIS — R030 Elevated blood-pressure reading, without diagnosis of hypertension: Secondary | ICD-10-CM

## 2020-07-23 LAB — LIPID PANEL
Cholesterol: 197 mg/dL (ref 0–200)
HDL: 69.1 mg/dL (ref >39.00)
LDL Cholesterol: 105 mg/dL — ABNORMAL HIGH (ref 0–99)
NonHDL: 127.57
Total CHOL/HDL Ratio: 3
Triglycerides: 113 mg/dL (ref 0.0–149.0)
VLDL: 22.6 mg/dL (ref 0.0–40.0)

## 2020-07-23 LAB — CBC
HCT: 39.1 % (ref 36.0–46.0)
Hemoglobin: 13.3 g/dL (ref 12.0–15.0)
MCHC: 34 g/dL (ref 30.0–36.0)
MCV: 93.1 fl (ref 78.0–100.0)
Platelets: 239 10*3/uL (ref 150.0–400.0)
RBC: 4.2 Mil/uL (ref 3.87–5.11)
RDW: 12.6 % (ref 11.5–15.5)
WBC: 5.9 10*3/uL (ref 4.0–10.5)

## 2020-07-23 LAB — COMPREHENSIVE METABOLIC PANEL
ALT: 29 U/L (ref 0–35)
AST: 23 U/L (ref 0–37)
Albumin: 4.2 g/dL (ref 3.5–5.2)
Alkaline Phosphatase: 66 U/L (ref 39–117)
BUN: 12 mg/dL (ref 6–23)
CO2: 25 mEq/L (ref 19–32)
Calcium: 9.1 mg/dL (ref 8.4–10.5)
Chloride: 105 mEq/L (ref 96–112)
Creatinine, Ser: 0.72 mg/dL (ref 0.40–1.20)
GFR: 86.42 mL/min (ref >60.00)
Glucose, Bld: 88 mg/dL (ref 70–99)
Potassium: 3.9 mEq/L (ref 3.5–5.1)
Sodium: 139 mEq/L (ref 135–145)
Total Bilirubin: 0.6 mg/dL (ref 0.2–1.2)
Total Protein: 7.1 g/dL (ref 6.0–8.3)

## 2020-07-23 LAB — HEMOGLOBIN A1C: Hgb A1c MFr Bld: 5.5 % (ref 4.6–6.5)

## 2020-07-23 LAB — VITAMIN D 25 HYDROXY (VIT D DEFICIENCY, FRACTURES): VITD: 71.83 ng/mL (ref 30.00–100.00)

## 2020-07-23 NOTE — Assessment & Plan Note (Signed)
Flu shot given. Covid-19 up to date including booster. Pneumonia complete. Shingrix counseled to get. Tetanus 2022. Colonoscopy due 2029. Mammogram due 2023, pap smear aged out and dexa declines further. Counseled about sun safety and mole surveillance. Counseled about the dangers of distracted driving. Given 10 year screening recommendations.

## 2020-07-23 NOTE — Assessment & Plan Note (Signed)
Stable overall.  

## 2020-07-23 NOTE — Assessment & Plan Note (Signed)
BP normal today, awaiting stress test results. Advised to monitor intermittently and call if high.

## 2020-07-23 NOTE — Assessment & Plan Note (Signed)
Checking vitamin D level today and she has restarted exercise to help.

## 2020-07-23 NOTE — Patient Instructions (Signed)
Health Maintenance, Female Adopting a healthy lifestyle and getting preventive care are important in promoting health and wellness. Ask your health care provider about:  The right schedule for you to have regular tests and exams.  Things you can do on your own to prevent diseases and keep yourself healthy. What should I know about diet, weight, and exercise? Eat a healthy diet   Eat a diet that includes plenty of vegetables, fruits, low-fat dairy products, and lean protein.  Do not eat a lot of foods that are high in solid fats, added sugars, or sodium. Maintain a healthy weight Body mass index (BMI) is used to identify weight problems. It estimates body fat based on height and weight. Your health care provider can help determine your BMI and help you achieve or maintain a healthy weight. Get regular exercise Get regular exercise. This is one of the most important things you can do for your health. Most adults should:  Exercise for at least 150 minutes each week. The exercise should increase your heart rate and make you sweat (moderate-intensity exercise).  Do strengthening exercises at least twice a week. This is in addition to the moderate-intensity exercise.  Spend less time sitting. Even light physical activity can be beneficial. Watch cholesterol and blood lipids Have your blood tested for lipids and cholesterol at 67 years of age, then have this test every 5 years. Have your cholesterol levels checked more often if:  Your lipid or cholesterol levels are high.  You are older than 67 years of age.  You are at high risk for heart disease. What should I know about cancer screening? Depending on your health history and family history, you may need to have cancer screening at various ages. This may include screening for:  Breast cancer.  Cervical cancer.  Colorectal cancer.  Skin cancer.  Lung cancer. What should I know about heart disease, diabetes, and high blood  pressure? Blood pressure and heart disease  High blood pressure causes heart disease and increases the risk of stroke. This is more likely to develop in people who have high blood pressure readings, are of African descent, or are overweight.  Have your blood pressure checked: ? Every 3-5 years if you are 18-39 years of age. ? Every year if you are 40 years old or older. Diabetes Have regular diabetes screenings. This checks your fasting blood sugar level. Have the screening done:  Once every three years after age 40 if you are at a normal weight and have a low risk for diabetes.  More often and at a younger age if you are overweight or have a high risk for diabetes. What should I know about preventing infection? Hepatitis B If you have a higher risk for hepatitis B, you should be screened for this virus. Talk with your health care provider to find out if you are at risk for hepatitis B infection. Hepatitis C Testing is recommended for:  Everyone born from 1945 through 1965.  Anyone with known risk factors for hepatitis C. Sexually transmitted infections (STIs)  Get screened for STIs, including gonorrhea and chlamydia, if: ? You are sexually active and are younger than 67 years of age. ? You are older than 67 years of age and your health care provider tells you that you are at risk for this type of infection. ? Your sexual activity has changed since you were last screened, and you are at increased risk for chlamydia or gonorrhea. Ask your health care provider if   you are at risk.  Ask your health care provider about whether you are at high risk for HIV. Your health care provider may recommend a prescription medicine to help prevent HIV infection. If you choose to take medicine to prevent HIV, you should first get tested for HIV. You should then be tested every 3 months for as long as you are taking the medicine. Pregnancy  If you are about to stop having your period (premenopausal) and  you may become pregnant, seek counseling before you get pregnant.  Take 400 to 800 micrograms (mcg) of folic acid every day if you become pregnant.  Ask for birth control (contraception) if you want to prevent pregnancy. Osteoporosis and menopause Osteoporosis is a disease in which the bones lose minerals and strength with aging. This can result in bone fractures. If you are 65 years old or older, or if you are at risk for osteoporosis and fractures, ask your health care provider if you should:  Be screened for bone loss.  Take a calcium or vitamin D supplement to lower your risk of fractures.  Be given hormone replacement therapy (HRT) to treat symptoms of menopause. Follow these instructions at home: Lifestyle  Do not use any products that contain nicotine or tobacco, such as cigarettes, e-cigarettes, and chewing tobacco. If you need help quitting, ask your health care provider.  Do not use street drugs.  Do not share needles.  Ask your health care provider for help if you need support or information about quitting drugs. Alcohol use  Do not drink alcohol if: ? Your health care provider tells you not to drink. ? You are pregnant, may be pregnant, or are planning to become pregnant.  If you drink alcohol: ? Limit how much you use to 0-1 drink a day. ? Limit intake if you are breastfeeding.  Be aware of how much alcohol is in your drink. In the [place] one drink equals one 12 oz bottle of beer (355 mL), one 5 oz glass of wine (148 mL), or one 1 oz glass of hard liquor (44 mL). General instructions  Schedule regular health, dental, and eye exams.  Stay current with your vaccines.  Tell your health care provider if: ? You often feel depressed. ? You have ever been abused or do not feel safe at home. Summary  Adopting a healthy lifestyle and getting preventive care are important in promoting health and wellness.  Follow your health care provider's instructions about healthy  diet, exercising, and getting tested or screened for diseases.  Follow your health care provider's instructions on monitoring your cholesterol and blood pressure. This information is not intended to replace advice given to you by your health care provider. Make sure you discuss any questions you have with your health care provider. Document Revised: 07/17/2018 Document Reviewed: 07/17/2018 Elsevier Patient Education  2020 [employer]vier Inc.  

## 2020-07-23 NOTE — Progress Notes (Signed)
Subjective:   Patient ID: Sharon Mcgee, female    DOB: 1950/08/14, 69 y.o.   MRN: [id number]  HPI Here for medicare wellness, no new complaints. Please see A/P for status and treatment of chronic medical problems.   HPI #2: Here for follow up blood pressure (been normal in the past month or so, getting stress test Jan 2022, denies chest pains or headaches).   Diet: heart healthy Physical activity: walking 1-2 miles 3-4 times per week Depression/mood screen: negative Hearing: intact to whispered voice, mild loss bilateral Visual acuity: grossly normal with lens, performs annual eye exam  ADLs: capable Fall risk: none Home safety: good Cognitive evaluation: intact to orientation, naming, recall and repetition EOL planning: adv directives discussed  [place] Office Visit from 07/23/2020 in [place] at [facility]  PHQ-2 Total Score [place] Office Visit from 07/23/2020 in [place] at [facility]  PHQ-9 Total Score 0    I have personally reviewed and have noted 1. The patient's medical and social history - reviewed today no changes 2. Their use of alcohol, tobacco or illicit drugs 3. Their current medications and supplements 4. The patient's functional ability including ADL's, fall risks, home safety risks and hearing or visual impairment. 5. Diet and physical activities 6. Evidence for depression or mood disorders 7. Care team reviewed and updated  Patient Care Team: Hoyt Koch, MD as PCP - General (Internal Medicine) Past Medical History:  Diagnosis Date  . Abdominal pain, RUQ   . GERD (gastroesophageal reflux disease)   . Hearing loss   . Osteopenia   . Tobacco use disorder    Past Surgical History:  Procedure Laterality Date  . laproscopy     fertility work up  . uterine polypectomy     Family History  Problem Relation Age of Onset  . Melanoma Father   . Colon cancer Maternal Grandfather   . Esophageal  cancer Neg Hx   . Liver cancer Neg Hx   . Pancreatic cancer Neg Hx   . Rectal cancer Neg Hx   . Stomach cancer Neg Hx    Review of Systems  Constitutional: Negative.   HENT: Negative.   Eyes: Negative.   Respiratory: Negative for cough, chest tightness and shortness of breath.   Cardiovascular: Negative for chest pain, palpitations and leg swelling.  Gastrointestinal: Negative for abdominal distention, abdominal pain, constipation, diarrhea, nausea and vomiting.  Musculoskeletal: Negative.   Skin: Negative.   Neurological: Negative.   Psychiatric/Behavioral: Negative.     Objective:  Physical Exam Constitutional:      Appearance: She is well-developed and well-nourished.  HENT:     Head: Normocephalic and atraumatic.  Eyes:     Extraocular Movements: EOM normal.  Cardiovascular:     Rate and Rhythm: Normal rate and regular rhythm.  Pulmonary:     Effort: Pulmonary effort is normal. No respiratory distress.     Breath sounds: Normal breath sounds. No wheezing or rales.  Abdominal:     General: Bowel sounds are normal. There is no distension.     Palpations: Abdomen is soft.     Tenderness: There is no abdominal tenderness. There is no rebound.  Musculoskeletal:        General: No edema.     Cervical back: Normal range of motion.  Skin:    General: Skin is warm and dry.  Neurological:     Mental Status: She is alert and  oriented to person, place, and time.     Coordination: Coordination normal.  Psychiatric:        Mood and Affect: Mood and affect normal.     Vitals:   07/23/20 1427  BP: 118/72  Pulse: 68  Temp: 98.5 F (36.9 C)  Weight: 203 lb 9.6 oz (92.4 kg)  Height: 5\' 7"  (1.702 m)   This visit occurred during the SARS-CoV-2 public health emergency.  Safety protocols were in place, including screening questions prior to the visit, additional usage of staff PPE, and extensive cleaning of exam room while observing appropriate contact time as indicated for  disinfecting solutions.   Assessment & Plan:  Flu shot given at visit

## 2020-08-06 ENCOUNTER — Other Ambulatory Visit (HOSPITAL_COMMUNITY)
Admission: RE | Admit: 2020-08-06 | Discharge: 2020-08-06 | Disposition: A | Discharge status: Home / Self Care | Payer: Medicare Other | Source: Ambulatory Visit | Attending: Internal Medicine | Admitting: Internal Medicine

## 2020-08-06 DIAGNOSIS — Z20822 Contact with and (suspected) exposure to covid-19: Secondary | ICD-10-CM | POA: Diagnosis not present

## 2020-08-06 DIAGNOSIS — Z01812 Encounter for preprocedural laboratory examination: Secondary | ICD-10-CM | POA: Diagnosis not present

## 2020-08-06 LAB — SARS CORONAVIRUS 2 (TAT 6-24 HRS): SARS Coronavirus 2: NEGATIVE

## 2020-08-10 ENCOUNTER — Other Ambulatory Visit: Payer: Self-pay | Admitting: Internal Medicine

## 2020-08-10 ENCOUNTER — Ambulatory Visit (INDEPENDENT_AMBULATORY_CARE_PROVIDER_SITE_OTHER): Payer: Medicare Other

## 2020-08-10 ENCOUNTER — Other Ambulatory Visit: Payer: Self-pay

## 2020-08-10 DIAGNOSIS — R030 Elevated blood-pressure reading, without diagnosis of hypertension: Secondary | ICD-10-CM | POA: Diagnosis not present

## 2020-08-10 DIAGNOSIS — R0789 Other chest pain: Secondary | ICD-10-CM

## 2020-08-10 LAB — EXERCISE TOLERANCE TEST
Estimated workload: 10.1 METS
Exercise duration (min): 8 min
Exercise duration (sec): 27 s
MPHR: 153 {beats}/min
Peak HR: 162 {beats}/min
Percent HR: 105 %
RPE: 15
Rest HR: 78 {beats}/min

## 2020-10-01 DIAGNOSIS — Z6833 Body mass index (BMI) 33.0-33.9, adult: Secondary | ICD-10-CM | POA: Diagnosis not present

## 2020-10-01 DIAGNOSIS — Z01419 Encounter for gynecological examination (general) (routine) without abnormal findings: Secondary | ICD-10-CM | POA: Diagnosis not present

## 2020-10-12 ENCOUNTER — Other Ambulatory Visit: Payer: Self-pay | Admitting: Obstetrics and Gynecology

## 2020-10-12 DIAGNOSIS — Z0000 Encounter for general adult medical examination without abnormal findings: Secondary | ICD-10-CM

## 2020-12-08 ENCOUNTER — Other Ambulatory Visit: Payer: Self-pay

## 2020-12-08 ENCOUNTER — Ambulatory Visit
Admission: RE | Admit: 2020-12-08 | Discharge: 2020-12-08 | Disposition: A | Discharge status: Home / Self Care | Payer: Medicare Other | Source: Ambulatory Visit | Attending: Obstetrics and Gynecology | Admitting: Obstetrics and Gynecology

## 2020-12-08 DIAGNOSIS — Z0000 Encounter for general adult medical examination without abnormal findings: Secondary | ICD-10-CM

## 2020-12-08 DIAGNOSIS — Z1231 Encounter for screening mammogram for malignant neoplasm of breast: Secondary | ICD-10-CM | POA: Diagnosis not present

## 2021-01-13 ENCOUNTER — Telehealth: Payer: Self-pay | Admitting: Internal Medicine

## 2021-01-13 NOTE — Telephone Encounter (Signed)
    Seeking advice   Patient tested covid+ on 6/8 Headache, some congestion

## 2021-01-14 MED ORDER — HYDROCODONE BIT-HOMATROP MBR 5-1.5 MG/5ML PO SOLN: 5.0000 mL | Freq: Four times a day (QID) | ORAL | 0 refills | Status: AC | PRN

## 2021-01-14 MED ORDER — NIRMATRELVIR/RITONAVIR (PAXLOVID)TABLET: 3.0000 | ORAL_TABLET | Freq: Two times a day (BID) | ORAL | 0 refills | Status: AC

## 2021-01-14 NOTE — Telephone Encounter (Signed)
See below

## 2021-01-14 NOTE — Telephone Encounter (Signed)
Ok for paxlovid course and cough med prn - done erx  Pls let pt know

## 2021-01-14 NOTE — Telephone Encounter (Signed)
Patient has been made aware of medication sent to her pharmacy. No other questions or concerns at this time.

## 2021-05-02 DIAGNOSIS — D1801 Hemangioma of skin and subcutaneous tissue: Secondary | ICD-10-CM | POA: Diagnosis not present

## 2021-05-02 DIAGNOSIS — L218 Other seborrheic dermatitis: Secondary | ICD-10-CM | POA: Diagnosis not present

## 2021-05-02 DIAGNOSIS — L821 Other seborrheic keratosis: Secondary | ICD-10-CM | POA: Diagnosis not present

## 2021-05-02 DIAGNOSIS — D225 Melanocytic nevi of trunk: Secondary | ICD-10-CM | POA: Diagnosis not present

## 2021-05-02 DIAGNOSIS — L570 Actinic keratosis: Secondary | ICD-10-CM | POA: Diagnosis not present

## 2021-05-02 DIAGNOSIS — B078 Other viral warts: Secondary | ICD-10-CM | POA: Diagnosis not present

## 2021-05-02 DIAGNOSIS — D224 Melanocytic nevi of scalp and neck: Secondary | ICD-10-CM | POA: Diagnosis not present

## 2021-05-02 DIAGNOSIS — L438 Other lichen planus: Secondary | ICD-10-CM | POA: Diagnosis not present

## 2021-05-02 DIAGNOSIS — L814 Other melanin hyperpigmentation: Secondary | ICD-10-CM | POA: Diagnosis not present

## 2021-05-02 DIAGNOSIS — D2262 Melanocytic nevi of left upper limb, including shoulder: Secondary | ICD-10-CM | POA: Diagnosis not present

## 2021-07-13 DIAGNOSIS — L570 Actinic keratosis: Secondary | ICD-10-CM | POA: Diagnosis not present

## 2021-07-13 DIAGNOSIS — L218 Other seborrheic dermatitis: Secondary | ICD-10-CM | POA: Diagnosis not present

## 2021-07-18 DIAGNOSIS — H5203 Hypermetropia, bilateral: Secondary | ICD-10-CM | POA: Diagnosis not present

## 2021-07-18 DIAGNOSIS — H524 Presbyopia: Secondary | ICD-10-CM | POA: Diagnosis not present

## 2021-07-18 DIAGNOSIS — H2513 Age-related nuclear cataract, bilateral: Secondary | ICD-10-CM | POA: Diagnosis not present

## 2021-07-18 DIAGNOSIS — H43811 Vitreous degeneration, right eye: Secondary | ICD-10-CM | POA: Diagnosis not present

## 2021-07-18 DIAGNOSIS — H52222 Regular astigmatism, left eye: Secondary | ICD-10-CM | POA: Diagnosis not present

## 2021-07-25 ENCOUNTER — Encounter: Payer: Self-pay | Admitting: Internal Medicine

## 2021-07-25 ENCOUNTER — Other Ambulatory Visit: Payer: Self-pay

## 2021-07-25 ENCOUNTER — Telehealth: Payer: Self-pay | Admitting: Internal Medicine

## 2021-07-25 ENCOUNTER — Ambulatory Visit (INDEPENDENT_AMBULATORY_CARE_PROVIDER_SITE_OTHER): Payer: Medicare Other | Admitting: Internal Medicine

## 2021-07-25 VITALS — BP 122/80 | HR 70 | Resp 18 | Ht 67.0 in | Wt 204.0 lb

## 2021-07-25 DIAGNOSIS — M8580 Other specified disorders of bone density and structure, unspecified site: Secondary | ICD-10-CM | POA: Diagnosis not present

## 2021-07-25 DIAGNOSIS — Z0000 Encounter for general adult medical examination without abnormal findings: Secondary | ICD-10-CM | POA: Diagnosis not present

## 2021-07-25 DIAGNOSIS — H9192 Unspecified hearing loss, left ear: Secondary | ICD-10-CM

## 2021-07-25 DIAGNOSIS — Z13220 Encounter for screening for lipoid disorders: Secondary | ICD-10-CM | POA: Diagnosis not present

## 2021-07-25 DIAGNOSIS — R7301 Impaired fasting glucose: Secondary | ICD-10-CM | POA: Diagnosis not present

## 2021-07-25 DIAGNOSIS — E559 Vitamin D deficiency, unspecified: Secondary | ICD-10-CM

## 2021-07-25 DIAGNOSIS — R030 Elevated blood-pressure reading, without diagnosis of hypertension: Secondary | ICD-10-CM | POA: Diagnosis not present

## 2021-07-25 DIAGNOSIS — E538 Deficiency of other specified B group vitamins: Secondary | ICD-10-CM | POA: Diagnosis not present

## 2021-07-25 LAB — LIPID PANEL
Cholesterol: 199 mg/dL (ref 0–200)
HDL: 79.1 mg/dL (ref >39.00)
LDL Cholesterol: 104 mg/dL — ABNORMAL HIGH (ref 0–99)
NonHDL: 119.9
Total CHOL/HDL Ratio: 3
Triglycerides: 78 mg/dL (ref 0.0–149.0)
VLDL: 15.6 mg/dL (ref 0.0–40.0)

## 2021-07-25 LAB — COMPREHENSIVE METABOLIC PANEL
ALT: 30 U/L (ref 0–35)
AST: 22 U/L (ref 0–37)
Albumin: 4.2 g/dL (ref 3.5–5.2)
Alkaline Phosphatase: 70 U/L (ref 39–117)
BUN: 12 mg/dL (ref 6–23)
CO2: 29 mEq/L (ref 19–32)
Calcium: 9.1 mg/dL (ref 8.4–10.5)
Chloride: 103 mEq/L (ref 96–112)
Creatinine, Ser: 0.73 mg/dL (ref 0.40–1.20)
GFR: 84.4 mL/min (ref >60.00)
Glucose, Bld: 93 mg/dL (ref 70–99)
Potassium: 4.3 mEq/L (ref 3.5–5.1)
Sodium: 139 mEq/L (ref 135–145)
Total Bilirubin: 0.7 mg/dL (ref 0.2–1.2)
Total Protein: 7 g/dL (ref 6.0–8.3)

## 2021-07-25 LAB — CBC
HCT: 39.5 % (ref 36.0–46.0)
Hemoglobin: 13.1 g/dL (ref 12.0–15.0)
MCHC: 33.2 g/dL (ref 30.0–36.0)
MCV: 94.8 fl (ref 78.0–100.0)
Platelets: 237 10*3/uL (ref 150.0–400.0)
RBC: 4.16 Mil/uL (ref 3.87–5.11)
RDW: 12.9 % (ref 11.5–15.5)
WBC: 5.5 10*3/uL (ref 4.0–10.5)

## 2021-07-25 LAB — VITAMIN D 25 HYDROXY (VIT D DEFICIENCY, FRACTURES): VITD: 52.67 ng/mL (ref 30.00–100.00)

## 2021-07-25 LAB — VITAMIN B12: Vitamin B-12: 732 pg/mL (ref 211–911)

## 2021-07-25 LAB — HEMOGLOBIN A1C: Hgb A1c MFr Bld: 5.6 % (ref 4.6–6.5)

## 2021-07-25 MED ORDER — HYDROCORTISONE (PERIANAL) 2.5 % EX CREA: 1.0000 "application " | TOPICAL_CREAM | Freq: Two times a day (BID) | CUTANEOUS | 11 refills | Status: DC

## 2021-07-25 NOTE — Progress Notes (Signed)
Subjective:   Patient ID: Sharon Mcgee, female    DOB: Jul 29, 1951, 70 y.o.   MRN: [id number]  HPI Here for medicare wellness, no new complaints. Please see A/P for status and treatment of chronic medical problems.   HPI #2: Here for follow up medical conditions  Diet: heart healthy Physical activity: sedentary, active in the yard Depression/mood screen: negative Hearing: intact to whispered voice Visual acuity: grossly normal with lens, performs annual eye exam  ADLs: capable Fall risk: none Home safety: good Cognitive evaluation: intact to orientation, naming, recall and repetition EOL planning: adv directives discussed  [place] Office Visit from 07/25/2021 in [place] at [facility]  PHQ-2 Total Score [place] Office Visit from 07/23/2020 in [place] at [facility]  PHQ-9 Total Score 0      Fall Risk 02/18/2018 07/21/2019 07/23/2020 07/25/2021  Falls in the past year? No 0 0 0  Was there an injury with Fall? - - 0 0  Fall Risk Category Calculator - - 0 0  Fall Risk Category - - Low Low  Patient Fall Risk Level - Low fall risk - -    I have personally reviewed and have noted 1. The patient's medical and social history - reviewed today no changes 2. Their use of alcohol, tobacco or illicit drugs 3. Their current medications and supplements 4. The patient's functional ability including ADL's, fall risks, home safety risks and hearing or visual impairment. 5. Diet and physical activities 6. Evidence for depression or mood disorders 7. Care team reviewed and updated 8.  The patient is not on an opioid pain medication.  Patient Care Team: Hoyt Koch, MD as PCP - General (Internal Medicine) Past Medical History:  Diagnosis Date   Abdominal pain, RUQ    GERD (gastroesophageal reflux disease)    Hearing loss    Osteopenia    Tobacco use disorder    Past Surgical History:  Procedure Laterality Date    laproscopy     fertility work up   uterine polypectomy     Family History  Problem Relation Age of Onset   Melanoma Father    Colon cancer Maternal Grandfather    Esophageal cancer Neg Hx    Liver cancer Neg Hx    Pancreatic cancer Neg Hx    Rectal cancer Neg Hx    Stomach cancer Neg Hx    Review of Systems  Constitutional: Negative.   HENT: Negative.    Eyes: Negative.   Respiratory:  Negative for cough, chest tightness and shortness of breath.   Cardiovascular:  Negative for chest pain, palpitations and leg swelling.  Gastrointestinal:  Negative for abdominal distention, abdominal pain, constipation, diarrhea, nausea and vomiting.  Musculoskeletal: Negative.   Skin: Negative.   Neurological: Negative.   Psychiatric/Behavioral: Negative.     Objective:  Physical Exam Constitutional:      Appearance: She is well-developed.  HENT:     Head: Normocephalic and atraumatic.  Cardiovascular:     Rate and Rhythm: Normal rate and regular rhythm.  Pulmonary:     Effort: Pulmonary effort is normal. No respiratory distress.     Breath sounds: Normal breath sounds. No wheezing or rales.  Abdominal:     General: Bowel sounds are normal. There is no distension.     Palpations: Abdomen is soft.     Tenderness: There is no abdominal tenderness. There is no rebound.  Musculoskeletal:  Cervical back: Normal range of motion.  Skin:    General: Skin is warm and dry.  Neurological:     Mental Status: She is alert and oriented to person, place, and time.     Coordination: Coordination normal.    Vitals:   07/25/21 1312  BP: 122/80  Pulse: 70  Resp: 18  SpO2: 98%  Weight: 204 lb (92.5 kg)  Height: 5\' 7"  (1.702 m)    This visit occurred during the SARS-CoV-2 public health emergency.  Safety protocols were in place, including screening questions prior to the visit, additional usage of staff PPE, and extensive cleaning of exam room while observing appropriate contact time  as indicated for disinfecting solutions.   Assessment & Plan:

## 2021-07-25 NOTE — Telephone Encounter (Signed)
Refilled

## 2021-07-25 NOTE — Telephone Encounter (Signed)
Ok to refill 

## 2021-07-25 NOTE — Telephone Encounter (Signed)
1.Medication Requested: hydrocortisone  2. Pharmacy (Name, [address], [place]): [id number]   3. On Med List: yes  4. Last Visit with PCP: 12.19.22  5. Next visit date with PCP: N/A   Agent: Please be advised that RX refills may take up to 3 business days. We ask that you follow-up with your pharmacy.

## 2021-07-26 ENCOUNTER — Encounter: Payer: Self-pay | Admitting: Internal Medicine

## 2021-07-26 NOTE — Assessment & Plan Note (Signed)
Checking vitamin D and calcium today. Encouraged exercise 3-5 times a week.

## 2021-07-26 NOTE — Assessment & Plan Note (Signed)
Flu shot declines this year. Covid-19 declines booster. Pneumonia complete. Shingrix complete. Tetanus due advised to get at pharmacy. Colonoscopy due 2029. Mammogram due 2024, pap smear aged out and dexa due 2023. Counseled about sun safety and mole surveillance. Counseled about the dangers of distracted driving. Given 10 year screening recommendations.

## 2021-07-26 NOTE — Assessment & Plan Note (Signed)
Has not been monitoring BP as much at home. BP at goal here and still needs monitoring every visit. Checking CMP for any changes.

## 2021-07-26 NOTE — Assessment & Plan Note (Signed)
Stable left ear.

## 2021-08-09 ENCOUNTER — Encounter: Payer: Self-pay | Admitting: Internal Medicine

## 2021-08-09 ENCOUNTER — Ambulatory Visit (INDEPENDENT_AMBULATORY_CARE_PROVIDER_SITE_OTHER): Payer: Medicare Other | Admitting: Internal Medicine

## 2021-08-09 ENCOUNTER — Ambulatory Visit (INDEPENDENT_AMBULATORY_CARE_PROVIDER_SITE_OTHER): Payer: Medicare Other

## 2021-08-09 ENCOUNTER — Other Ambulatory Visit: Payer: Self-pay

## 2021-08-09 VITALS — BP 126/80 | HR 67 | Resp 18 | Ht 67.0 in | Wt 209.0 lb

## 2021-08-09 DIAGNOSIS — Z0389 Encounter for observation for other suspected diseases and conditions ruled out: Secondary | ICD-10-CM | POA: Diagnosis not present

## 2021-08-09 DIAGNOSIS — R079 Chest pain, unspecified: Secondary | ICD-10-CM | POA: Insufficient documentation

## 2021-08-09 MED ORDER — IBUPROFEN 800 MG PO TABS: 800.0000 mg | ORAL_TABLET | Freq: Three times a day (TID) | ORAL | 0 refills | Status: AC | PRN

## 2021-08-09 NOTE — Assessment & Plan Note (Signed)
EKG without changes, given that pain is ongoing 5 days ACS unlikely. Checking CXR to rule out fracture, pneumonia. Continue to use advil prn for pain. Not able to rule out shingles fully however rash started weeks before pain so unlikely. She is fully vaccinated against shingles.

## 2021-08-09 NOTE — Patient Instructions (Addendum)
We have done the EKG which is normal and will check the chest x-ray.

## 2021-08-09 NOTE — Progress Notes (Signed)
° °  Subjective:   Patient ID: Sharon Mcgee, female    DOB: [birth year], 71 y.o.   MRN: [id number]  HPI The patient is a 71 YO female coming in for concerns about pain and blister rash on left chest wall. Started about 5 days ago. Rash about 3 weeks ago and not related to the pain.  Review of Systems  Constitutional:  Positive for activity change.  HENT: Negative.    Eyes: Negative.   Respiratory:  Positive for shortness of breath. Negative for cough and chest tightness.   Cardiovascular:  Positive for chest pain. Negative for palpitations and leg swelling.  Gastrointestinal:  Negative for abdominal distention, abdominal pain, constipation, diarrhea, nausea and vomiting.  Musculoskeletal: Negative.   Skin: Negative.   Neurological: Negative.   Psychiatric/Behavioral: Negative.     Objective:  Physical Exam Constitutional:      Appearance: She is well-developed.  HENT:     Head: Normocephalic and atraumatic.  Cardiovascular:     Rate and Rhythm: Normal rate and regular rhythm.  Pulmonary:     Effort: Pulmonary effort is normal. No respiratory distress.     Breath sounds: Normal breath sounds. No wheezing or rales.     Comments: Small red bumps under left breast not consistent with shingles Chest:     Chest wall: Tenderness present.  Abdominal:     General: Bowel sounds are normal. There is no distension.     Palpations: Abdomen is soft.     Tenderness: There is no abdominal tenderness. There is no rebound.  Musculoskeletal:     Cervical back: Normal range of motion.  Skin:    General: Skin is warm and dry.  Neurological:     Mental Status: She is alert and oriented to person, place, and time.     Coordination: Coordination normal.    Vitals:   08/09/21 1300  BP: 126/80  Pulse: 67  Resp: 18  SpO2: 98%  Weight: 209 lb (94.8 kg)  Height: 5\' 7"  (1.702 m)   EKG: Rate 62, axis normal, interval normal, sinus, no st or t wave changes, no significant change compared to  prior 2021  This visit occurred during the SARS-CoV-2 public health emergency.  Safety protocols were in place, including screening questions prior to the visit, additional usage of staff PPE, and extensive cleaning of exam room while observing appropriate contact time as indicated for disinfecting solutions.   Assessment & Plan:

## 2021-08-10 ENCOUNTER — Telehealth: Payer: Self-pay

## 2021-08-10 NOTE — Telephone Encounter (Signed)
Spoke with the pt about her xray results and pt forgot to mention what she could do keep her LDL level low?

## 2021-08-11 NOTE — Telephone Encounter (Signed)
Mediterranean diet and exercise regularly.

## 2021-08-12 NOTE — Telephone Encounter (Signed)
See my chart message

## 2021-11-23 DIAGNOSIS — Z1151 Encounter for screening for human papillomavirus (HPV): Secondary | ICD-10-CM | POA: Diagnosis not present

## 2021-11-23 DIAGNOSIS — Z124 Encounter for screening for malignant neoplasm of cervix: Secondary | ICD-10-CM | POA: Diagnosis not present

## 2021-11-23 DIAGNOSIS — Z6833 Body mass index (BMI) 33.0-33.9, adult: Secondary | ICD-10-CM | POA: Diagnosis not present

## 2021-12-02 ENCOUNTER — Other Ambulatory Visit: Payer: Self-pay | Admitting: Obstetrics and Gynecology

## 2021-12-02 DIAGNOSIS — Z1231 Encounter for screening mammogram for malignant neoplasm of breast: Secondary | ICD-10-CM

## 2021-12-02 DIAGNOSIS — R102 Pelvic and perineal pain: Secondary | ICD-10-CM | POA: Diagnosis not present

## 2021-12-16 ENCOUNTER — Ambulatory Visit
Admission: RE | Admit: 2021-12-16 | Discharge: 2021-12-16 | Disposition: A | Discharge status: Home / Self Care | Payer: Medicare Other | Source: Ambulatory Visit | Attending: Obstetrics and Gynecology | Admitting: Obstetrics and Gynecology

## 2021-12-16 DIAGNOSIS — Z1231 Encounter for screening mammogram for malignant neoplasm of breast: Secondary | ICD-10-CM | POA: Diagnosis not present

## 2022-05-08 DIAGNOSIS — L814 Other melanin hyperpigmentation: Secondary | ICD-10-CM | POA: Diagnosis not present

## 2022-05-08 DIAGNOSIS — D485 Neoplasm of uncertain behavior of skin: Secondary | ICD-10-CM | POA: Diagnosis not present

## 2022-05-08 DIAGNOSIS — L821 Other seborrheic keratosis: Secondary | ICD-10-CM | POA: Diagnosis not present

## 2022-05-08 DIAGNOSIS — L570 Actinic keratosis: Secondary | ICD-10-CM | POA: Diagnosis not present

## 2022-05-08 DIAGNOSIS — D224 Melanocytic nevi of scalp and neck: Secondary | ICD-10-CM | POA: Diagnosis not present

## 2022-05-08 DIAGNOSIS — L820 Inflamed seborrheic keratosis: Secondary | ICD-10-CM | POA: Diagnosis not present

## 2022-05-08 DIAGNOSIS — D1801 Hemangioma of skin and subcutaneous tissue: Secondary | ICD-10-CM | POA: Diagnosis not present

## 2022-05-08 DIAGNOSIS — L218 Other seborrheic dermatitis: Secondary | ICD-10-CM | POA: Diagnosis not present

## 2022-05-08 DIAGNOSIS — D2371 Other benign neoplasm of skin of right lower limb, including hip: Secondary | ICD-10-CM | POA: Diagnosis not present

## 2022-07-12 ENCOUNTER — Telehealth: Payer: Self-pay | Admitting: Internal Medicine

## 2022-07-12 NOTE — Telephone Encounter (Signed)
LVM for pt to rtn my call to schedule AWV with [facility] call back # [phone] 

## 2022-08-01 ENCOUNTER — Telehealth: Payer: Self-pay | Admitting: Internal Medicine

## 2022-08-01 NOTE — Telephone Encounter (Signed)
LVM informing pt that I had to re-schedule her [facility] appt on 08/04/22 to 08/29/22 due to the [facility] being out sick. Informed her to please give me a call back if day and time was not good for her.

## 2022-08-02 ENCOUNTER — Encounter: Payer: Medicare Other | Admitting: Internal Medicine

## 2022-08-04 ENCOUNTER — Ambulatory Visit (INDEPENDENT_AMBULATORY_CARE_PROVIDER_SITE_OTHER): Payer: Medicare Other | Admitting: Internal Medicine

## 2022-08-04 ENCOUNTER — Ambulatory Visit: Payer: Medicare Other

## 2022-08-04 ENCOUNTER — Encounter: Payer: Self-pay | Admitting: Internal Medicine

## 2022-08-04 VITALS — BP 120/80 | HR 72 | Temp 98.0°F | Ht 67.0 in | Wt 208.0 lb

## 2022-08-04 DIAGNOSIS — R7301 Impaired fasting glucose: Secondary | ICD-10-CM | POA: Diagnosis not present

## 2022-08-04 DIAGNOSIS — R030 Elevated blood-pressure reading, without diagnosis of hypertension: Secondary | ICD-10-CM | POA: Diagnosis not present

## 2022-08-04 DIAGNOSIS — R079 Chest pain, unspecified: Secondary | ICD-10-CM | POA: Diagnosis not present

## 2022-08-04 DIAGNOSIS — E663 Overweight: Secondary | ICD-10-CM | POA: Diagnosis not present

## 2022-08-04 DIAGNOSIS — E538 Deficiency of other specified B group vitamins: Secondary | ICD-10-CM

## 2022-08-04 DIAGNOSIS — Z0000 Encounter for general adult medical examination without abnormal findings: Secondary | ICD-10-CM

## 2022-08-04 DIAGNOSIS — E559 Vitamin D deficiency, unspecified: Secondary | ICD-10-CM

## 2022-08-04 DIAGNOSIS — M8580 Other specified disorders of bone density and structure, unspecified site: Secondary | ICD-10-CM | POA: Diagnosis not present

## 2022-08-04 LAB — COMPREHENSIVE METABOLIC PANEL
ALT: 33 U/L (ref 0–35)
AST: 23 U/L (ref 0–37)
Albumin: 4.3 g/dL (ref 3.5–5.2)
Alkaline Phosphatase: 60 U/L (ref 39–117)
BUN: 23 mg/dL (ref 6–23)
CO2: 29 mEq/L (ref 19–32)
Calcium: 9.2 mg/dL (ref 8.4–10.5)
Chloride: 105 mEq/L (ref 96–112)
Creatinine, Ser: 0.73 mg/dL (ref 0.40–1.20)
GFR: 83.79 mL/min (ref >60.00)
Glucose, Bld: 100 mg/dL — ABNORMAL HIGH (ref 70–99)
Potassium: 4.8 mEq/L (ref 3.5–5.1)
Sodium: 140 mEq/L (ref 135–145)
Total Bilirubin: 0.6 mg/dL (ref 0.2–1.2)
Total Protein: 7 g/dL (ref 6.0–8.3)

## 2022-08-04 LAB — LIPID PANEL
Cholesterol: 222 mg/dL — ABNORMAL HIGH (ref 0–200)
HDL: 68.1 mg/dL (ref >39.00)
LDL Cholesterol: 135 mg/dL — ABNORMAL HIGH (ref 0–99)
NonHDL: 153.75
Total CHOL/HDL Ratio: 3
Triglycerides: 94 mg/dL (ref 0.0–149.0)
VLDL: 18.8 mg/dL (ref 0.0–40.0)

## 2022-08-04 LAB — CBC
HCT: 40.3 % (ref 36.0–46.0)
Hemoglobin: 13.6 g/dL (ref 12.0–15.0)
MCHC: 33.7 g/dL (ref 30.0–36.0)
MCV: 94.1 fl (ref 78.0–100.0)
Platelets: 257 10*3/uL (ref 150.0–400.0)
RBC: 4.28 Mil/uL (ref 3.87–5.11)
RDW: 12.5 % (ref 11.5–15.5)
WBC: 6.1 10*3/uL (ref 4.0–10.5)

## 2022-08-04 LAB — VITAMIN B12: Vitamin B-12: 1052 pg/mL — ABNORMAL HIGH (ref 211–911)

## 2022-08-04 LAB — VITAMIN D 25 HYDROXY (VIT D DEFICIENCY, FRACTURES): VITD: 70.2 ng/mL (ref 30.00–100.00)

## 2022-08-04 LAB — HEMOGLOBIN A1C: Hgb A1c MFr Bld: 5.7 % (ref 4.6–6.5)

## 2022-08-04 MED ORDER — HYDROCORTISONE (PERIANAL) 2.5 % EX CREA: 1.0000 | TOPICAL_CREAM | Freq: Two times a day (BID) | CUTANEOUS | 11 refills | Status: DC

## 2022-08-04 NOTE — Assessment & Plan Note (Signed)
Suspect GERD and ongoing stable since Jan this year. Getting CT calcium score to help risk stratify and clarify if statin is needed. Advised to move pepcid closer to dinner meal. GERD diet discussed. She does not want PPI at this time and benefit/risk of PPIs were discussed today.

## 2022-08-04 NOTE — Patient Instructions (Signed)
We will check the labs today. 

## 2022-08-04 NOTE — Assessment & Plan Note (Addendum)
Checking lipid panel and HgA1c.

## 2022-08-04 NOTE — Assessment & Plan Note (Signed)
Checking vitamin D and calcium today. Exercise weight bearing encouraged.

## 2022-08-04 NOTE — Assessment & Plan Note (Signed)
BP normal today and encouraged exercise. Continue close monitoring. Ordered CT calcium score given overall risk as this could change therapy.

## 2022-08-04 NOTE — Progress Notes (Signed)
   Subjective:   Patient ID: Sharon Mcgee, female    DOB: 31-May-1951, 71 y.o.   MRN: [id number]  HPI The patient is a 71 YO female coming in for follow up. Still having GERD  Review of Systems  Constitutional: Negative.   HENT: Negative.    Eyes: Negative.   Respiratory:  Negative for cough, chest tightness and shortness of breath.   Cardiovascular:  Negative for chest pain, palpitations and leg swelling.  Gastrointestinal:  Negative for abdominal distention, abdominal pain, constipation, diarrhea, nausea and vomiting.       GERD  Musculoskeletal: Negative.   Skin: Negative.   Neurological: Negative.   Psychiatric/Behavioral: Negative.      Objective:  Physical Exam Constitutional:      Appearance: She is well-developed.  HENT:     Head: Normocephalic and atraumatic.  Cardiovascular:     Rate and Rhythm: Normal rate and regular rhythm.  Pulmonary:     Effort: Pulmonary effort is normal. No respiratory distress.     Breath sounds: Normal breath sounds. No wheezing or rales.  Abdominal:     General: Bowel sounds are normal. There is no distension.     Palpations: Abdomen is soft.     Tenderness: There is no abdominal tenderness. There is no rebound.  Musculoskeletal:     Cervical back: Normal range of motion.  Skin:    General: Skin is warm and dry.  Neurological:     Mental Status: She is alert and oriented to person, place, and time.     Coordination: Coordination normal.     Vitals:   08/04/22 1344  BP: 120/80  Pulse: 72  Temp: 98 F (36.7 C)  TempSrc: Oral  SpO2: 95%  Weight: 208 lb (94.3 kg)  Height: '5\' 7"'$  (1.702 m)    Assessment & Plan:

## 2022-08-18 ENCOUNTER — Ambulatory Visit (HOSPITAL_COMMUNITY)
Admission: RE | Admit: 2022-08-18 | Discharge: 2022-08-18 | Disposition: A | Discharge status: Home / Self Care | Payer: Medicare Other | Source: Ambulatory Visit | Attending: Internal Medicine | Admitting: Internal Medicine

## 2022-08-18 DIAGNOSIS — R030 Elevated blood-pressure reading, without diagnosis of hypertension: Secondary | ICD-10-CM

## 2022-08-29 ENCOUNTER — Ambulatory Visit: Payer: Medicare Other

## 2022-09-04 ENCOUNTER — Ambulatory Visit: Payer: Medicare Other

## 2022-09-15 ENCOUNTER — Ambulatory Visit (INDEPENDENT_AMBULATORY_CARE_PROVIDER_SITE_OTHER): Payer: Medicare Other

## 2022-09-15 VITALS — BP 118/60 | HR 66 | Temp 97.7°F | Ht 67.0 in | Wt 210.0 lb

## 2022-09-15 DIAGNOSIS — Z0000 Encounter for general adult medical examination without abnormal findings: Secondary | ICD-10-CM

## 2022-09-15 NOTE — Patient Instructions (Addendum)
Sharon Mcgee , Thank you for taking time to come for your Medicare Wellness Visit. I appreciate your ongoing commitment to your health goals. Please review the following plan we discussed and let me know if I can assist you in the future.   These are the goals we discussed:  Goals      My goal for 2024 is to lose weight.  My weigh goal is to be 195 lbs.        This is a list of the screening recommended for you and due dates:  Health Maintenance  Topic Date Due   DTaP/Tdap/Td vaccine (1 - Tdap) Never done   COVID-19 Vaccine (3 - 2023-24 season) 04/07/2022   Medicare Annual Wellness Visit  07/25/2022   Flu Shot  11/05/2022*   Mammogram  12/17/2023   Colon Cancer Screening  10/20/2027   Pneumonia Vaccine  Completed   DEXA scan (bone density measurement)  Completed   Hepatitis C Screening: USPSTF Recommendation to screen - Ages 41-79 yo.  Completed   Zoster (Shingles) Vaccine  Completed   HPV Vaccine  Aged Out  *Topic was postponed. The date shown is not the original due date.    Advanced directives: Yes  Conditions/risks identified: Yes  Next appointment: Follow up in one year for your annual wellness visit.   Preventive Care 20 Years and Older, Female Preventive care refers to lifestyle choices and visits with your health care provider that can promote health and wellness. What does preventive care include? A yearly physical exam. This is also called an annual well check. Dental exams once or twice a year. Routine eye exams. Ask your health care provider how often you should have your eyes checked. Personal lifestyle choices, including: Daily care of your teeth and gums. Regular physical activity. Eating a healthy diet. Avoiding tobacco and drug use. Limiting alcohol use. Practicing safe sex. Taking low-dose aspirin every day. Taking vitamin and mineral supplements as recommended by your health care provider. What happens during an annual well check? The services and  screenings done by your health care provider during your annual well check will depend on your age, overall health, lifestyle risk factors, and family history of disease. Counseling  Your health care provider may ask you questions about your: Alcohol use. Tobacco use. Drug use. Emotional well-being. Home and relationship well-being. Sexual activity. Eating habits. History of falls. Memory and ability to understand (cognition). Work and work Statistician. Reproductive health. Screening  You may have the following tests or measurements: Height, weight, and BMI. Blood pressure. Lipid and cholesterol levels. These may be checked every 5 years, or more frequently if you are over 68 years old. Skin check. Lung cancer screening. You may have this screening every year starting at age 31 if you have a 30-pack-year history of smoking and currently smoke or have quit within the past 15 years. Fecal occult blood test (FOBT) of the stool. You may have this test every year starting at age 19. Flexible sigmoidoscopy or colonoscopy. You may have a sigmoidoscopy every 5 years or a colonoscopy every 10 years starting at age 66. Hepatitis C blood test. Hepatitis B blood test. Sexually transmitted disease (STD) testing. Diabetes screening. This is done by checking your blood sugar (glucose) after you have not eaten for a while (fasting). You may have this done every 1-3 years. Bone density scan. This is done to screen for osteoporosis. You may have this done starting at age 33. Mammogram. This may be done every  1-2 years. Talk to your health care provider about how often you should have regular mammograms. Talk with your health care provider about your test results, treatment options, and if necessary, the need for more tests. Vaccines  Your health care provider may recommend certain vaccines, such as: Influenza vaccine. This is recommended every year. Tetanus, diphtheria, and acellular pertussis (Tdap,  Td) vaccine. You may need a Td booster every 10 years. Zoster vaccine. You may need this after age 90. Pneumococcal 13-valent conjugate (PCV13) vaccine. One dose is recommended after age 47. Pneumococcal polysaccharide (PPSV23) vaccine. One dose is recommended after age 23. Talk to your health care provider about which screenings and vaccines you need and how often you need them. This information is not intended to replace advice given to you by your health care provider. Make sure you discuss any questions you have with your health care provider. Document Released: 08/20/2015 Document Revised: 04/12/2016 Document Reviewed: 05/25/2015 Elsevier Interactive Patient Education  2017 [place] Prevention in the Home Falls can cause injuries. They can happen to people of all ages. There are many things you can do to make your home safe and to help prevent falls. What can I do on the outside of my home? Regularly fix the edges of walkways and driveways and fix any cracks. Remove anything that might make you trip as you walk through a door, such as a raised step or threshold. Trim any bushes or trees on the path to your home. Use bright outdoor lighting. Clear any walking paths of anything that might make someone trip, such as rocks or tools. Regularly check to see if handrails are loose or broken. Make sure that both sides of any steps have handrails. Any raised decks and porches should have guardrails on the edges. Have any leaves, snow, or ice cleared regularly. Use sand or salt on walking paths during winter. Clean up any spills in your garage right away. This includes oil or grease spills. What can I do in the bathroom? Use night lights. Install grab bars by the toilet and in the tub and shower. Do not use towel bars as grab bars. Use non-skid mats or decals in the tub or shower. If you need to sit down in the shower, use a plastic, non-slip stool. Keep the floor dry. Clean up any  water that spills on the floor as soon as it happens. Remove soap buildup in the tub or shower regularly. Attach bath mats securely with double-sided non-slip rug tape. Do not have throw rugs and other things on the floor that can make you trip. What can I do in the bedroom? Use night lights. Make sure that you have a light by your bed that is easy to reach. Do not use any sheets or blankets that are too big for your bed. They should not hang down onto the floor. Have a firm chair that has side arms. You can use this for support while you get dressed. Do not have throw rugs and other things on the floor that can make you trip. What can I do in the kitchen? Clean up any spills right away. Avoid walking on wet floors. Keep items that you use a lot in easy-to-reach places. If you need to reach something above you, use a strong step stool that has a grab bar. Keep electrical cords out of the way. Do not use floor polish or wax that makes floors slippery. If you must use wax, use non-skid  floor wax. Do not have throw rugs and other things on the floor that can make you trip. What can I do with my stairs? Do not leave any items on the stairs. Make sure that there are handrails on both sides of the stairs and use them. Fix handrails that are broken or loose. Make sure that handrails are as long as the stairways. Check any carpeting to make sure that it is firmly attached to the stairs. Fix any carpet that is loose or worn. Avoid having throw rugs at the top or bottom of the stairs. If you do have throw rugs, attach them to the floor with carpet tape. Make sure that you have a light switch at the top of the stairs and the bottom of the stairs. If you do not have them, ask someone to add them for you. What else can I do to help prevent falls? Wear shoes that: Do not have high heels. Have rubber bottoms. Are comfortable and fit you well. Are closed at the toe. Do not wear sandals. If you use a  stepladder: Make sure that it is fully opened. Do not climb a closed stepladder. Make sure that both sides of the stepladder are locked into place. Ask someone to hold it for you, if possible. Clearly mark and make sure that you can see: Any grab bars or handrails. First and last steps. Where the edge of each step is. Use tools that help you move around (mobility aids) if they are needed. These include: Canes. Walkers. Scooters. Crutches. Turn on the lights when you go into a dark area. Replace any light bulbs as soon as they burn out. Set up your furniture so you have a clear path. Avoid moving your furniture around. If any of your floors are uneven, fix them. If there are any pets around you, be aware of where they are. Review your medicines with your doctor. Some medicines can make you feel dizzy. This can increase your chance of falling. Ask your doctor what other things that you can do to help prevent falls. This information is not intended to replace advice given to you by your health care provider. Make sure you discuss any questions you have with your health care provider. Document Released: 05/20/2009 Document Revised: 12/30/2015 Document Reviewed: 08/28/2014 Elsevier Interactive Patient Education  2017 [employer].

## 2022-09-15 NOTE — Progress Notes (Signed)
Subjective:   Sharon Mcgee is a 72 y.o. female who presents for Medicare Annual (Subsequent) preventive examination.  Review of Systems:    Sleep Patterns: No sleep issues, feels rested on waking and sleeps 8 hours nightly. Home Safety/Smoke Alarms: Feels safe in home; uses home alarm. Smoke alarms in place. Living environment: 2-story home; Lives with spouse; no needs for DME; good support system. Seat Belt Safety/Bike Helmet: Wears seat belt.   Cardiac Risk Factors include: advanced age (>38mn, >>80women);obesity (BMI >30kg/m2)     Objective:    Today's Vitals   09/15/22 1112  BP: 118/60  Pulse: 66  Temp: 97.7 F (36.5 C)  SpO2: 98%  Weight: 210 lb (95.3 kg)  Height: 5' 7"$  (1.702 m)  PainSc: 0-No pain   Body mass index is 32.89 kg/m.     09/15/2022   11:22 AM  Advanced Directives  Does Patient Have a Medical Advance Directive? Yes  Type of Advance Directive Living will    Current Medications (verified) Outpatient Encounter Medications as of 09/15/2022  Medication Sig   Ascorbic Acid (VITAMIN C) 1000 MG tablet Take 1,000 mg by mouth in the morning and at bedtime.   b complex vitamins capsule Take 1 capsule by mouth daily.   betamethasone, augmented, (DIPROLENE) 0.05 % lotion Apply topically.   Cholecalciferol (VITAMIN D3) 2000 units TABS Take by mouth.   cyanocobalamin 1000 MCG tablet Take 1,000 mcg by mouth daily.   famotidine (PEPCID) 20 MG tablet Take 20 mg by mouth daily. (Patient not taking: Reported on 08/04/2022)   hydrocortisone (ANUSOL-HC) 2.5 % rectal cream Place 1 Application rectally 2 (two) times daily.   ibuprofen (ADVIL) 800 MG tablet Take 1 tablet (800 mg total) by mouth every 8 (eight) hours as needed.   magnesium gluconate (MAGONATE) 500 MG tablet Take 500 mg by mouth 2 (two) times daily.   Multiple Vitamins-Minerals (PRESERVISION AREDS 2) CAPS Take by mouth.   omega-3 acid ethyl esters (LOVAZA) 1 g capsule Take by mouth 2 (two) times  daily.   triamcinolone cream (KENALOG) 0.1 % Apply 1 application topically 2 (two) times daily.   vitamin E 180 MG (400 UNITS) capsule Take 400 Units by mouth daily.   No facility-administered encounter medications on file as of 09/15/2022.    Allergies (verified) Patient has no known allergies.   History: Past Medical History:  Diagnosis Date   Abdominal pain, RUQ    GERD (gastroesophageal reflux disease)    Hearing loss    Osteopenia    Tobacco use disorder    Past Surgical History:  Procedure Laterality Date   laproscopy     fertility work up   uterine polypectomy     Family History  Problem Relation Age of Onset   Melanoma Father    Colon cancer Maternal Grandfather    Esophageal cancer Neg Hx    Liver cancer Neg Hx    Pancreatic cancer Neg Hx    Rectal cancer Neg Hx    Stomach cancer Neg Hx    Breast cancer Neg Hx    Social History   Socioeconomic History   Marital status: Married    Spouse name: Not on file   Number of children: Not on file   Years of education: Not on file   Highest education level: Not on file  Occupational History   Not on file  Tobacco Use   Smoking status: Former   Smokeless tobacco: Never  Substance and Sexual Activity  Alcohol use: Yes    Comment: two glasses of wine   Drug use: No   Sexual activity: Yes    Partners: Male  Other Topics Concern   Not on file  Social History Narrative   HSG. College Grad in Education officer, museum. Married '89. Works- Soil scientist. Avid gardner. Travels to [place] to see her mother every year.Things are going well   Social Determinants of Health   Financial Resource Strain: Low Risk  (09/15/2022)   Overall Financial Resource Strain (CARDIA)    Difficulty of Paying Living Expenses: Not hard at all  Food Insecurity: No Food Insecurity (09/15/2022)   Hunger Vital Sign    Worried About Running Out of Food in the Last Year: Never true    Ran Out of Food in the Last Year: Never true  Transportation Needs: No  Transportation Needs (09/15/2022)   PRAPARE - Hydrologist (Medical): No    Lack of Transportation (Non-Medical): No  Physical Activity: Not on file  Stress: No Stress Concern Present (09/15/2022)   [place]    Feeling of Stress : Not at all  Social Connections: [place] (09/15/2022)   Social Connection and Isolation Panel [NHANES]    Frequency of Communication with Friends and Family: More than three times a week    Frequency of Social Gatherings with Friends and Family: More than three times a week    Attends Religious Services: More than 4 times per year    Active Member of [employer] or Organizations: Yes    Attends Music therapist: More than 4 times per year    Marital Status: Married    Tobacco Counseling Counseling given: Not Answered   Clinical Intake:  Pre-visit preparation completed: Yes  Pain : No/denies pain Pain Score: 0-No pain     BMI - recorded: 32.89 Nutritional Status: BMI > 30  Obese Nutritional Risks: None Diabetes: No  How often do you need to have someone help you when you read instructions, pamphlets, or other written materials from your doctor or pharmacy?: 1 - Never What is the last grade level you completed in school?: [employer]  Diabetic? no  Interpreter Needed?: No  Information entered by :: Felicha Frayne N. Chrles Selley, LPN.   Activities of Daily Living    09/15/2022   11:15 AM  In your present state of health, do you have any difficulty performing the following activities:  Hearing? 0  Vision? 0  Difficulty concentrating or making decisions? 0  Walking or climbing stairs? 0  Dressing or bathing? 0  Doing errands, shopping? 0  Preparing Food and eating ? N  Using the Toilet? N  In the past six months, have you accidently leaked urine? N  Do you have problems with loss of bowel control? N  Managing your Medications? N   Managing your Finances? N  Housekeeping or managing your Housekeeping? N    Patient Care Team: Hoyt Koch, MD as PCP - General (Internal Medicine) Neita Goodnight, OD. as Consulting Physician (Optometry)  Indicate any recent Medical Services you may have received from other than [facility] providers in the past year (date may be approximate).     Assessment:   This is a routine wellness examination for Sharon Mcgee.  Hearing/Vision screen Hearing Screening - Comments:: Patient has 70% hearing loss in the left ear; No hearing aids. Vision Screening - Comments:: Wears rx glasses - up to date with routine eye  exams with Marcial Pacas, OD.   Dietary issues and exercise activities discussed: Current Exercise Habits: Home exercise routine, Type of exercise: walking;treadmill;Other - see comments (stationary bike), Time (Minutes): 30, Frequency (Times/Week): 7, Weekly Exercise (Minutes/Week): 210, Intensity: Moderate, Exercise limited by: None identified   Goals Addressed             This Visit's Progress    My goal for 2024 is to lose weight.  My weigh goal is to be 195 lbs.        Depression Screen    09/15/2022   11:14 AM 07/25/2021    1:12 PM 07/23/2020    2:41 PM 07/21/2019   12:57 PM 02/18/2018   11:12 AM  PHQ 2/9 Scores  PHQ - 2 Score 0 0 0 0 0  PHQ- 9 Score   0      Fall Risk    09/15/2022   11:15 AM 07/25/2021    1:12 PM 07/23/2020    2:41 PM 07/21/2019   12:57 PM 02/18/2018   11:12 AM  [place] in the past year? 0 0 0 0 No  Number falls in past yr: 0 0 0    Injury with Fall? 0 0 0    Risk for fall due to : No Fall Risks      Follow up Falls prevention discussed        [place]:  Any stairs in or around the home? Yes  If so, are there any without handrails? No  Home free of loose throw rugs in walkways, pet beds, electrical cords, etc? Yes  Adequate lighting in your home to reduce risk of falls? Yes   ASSISTIVE  DEVICES UTILIZED TO PREVENT FALLS:  Life alert? No  Use of a cane, walker or w/c? No  Grab bars in the bathroom? Yes  Shower chair or bench in shower? No  Elevated toilet seat or a handicapped toilet? Yes   TIMED UP AND GO:  Was the test performed? Yes .  Length of time to ambulate 10 feet: 8 sec.   Gait steady and fast without use of assistive device  Cognitive Function:        09/15/2022   11:15 AM  6CIT Screen  What Year? 0 points  What month? 0 points  What time? 0 points  Count back from 20 0 points  Months in reverse 0 points  Repeat phrase 0 points  Total Score 0 points    Immunizations Immunization History  Administered Date(s) Administered   Fluad Quad(high Dose 65+) 07/23/2020   Influenza Whole 06/07/2009, 05/06/2014   Influenza-Unspecified 05/08/2015, 05/19/2016, 05/04/2017, 06/07/2018, 05/22/2019   PFIZER(Purple Top)SARS-COV-2 Vaccination 08/27/2019, 09/16/2019   Pneumococcal Conjugate-13 07/18/2018   Pneumococcal Polysaccharide-23 07/21/2019   Pneumococcal-Unspecified 07/21/2019   Zoster Recombinat (Shingrix) 10/18/2017, 10/22/2017, 01/03/2018   Zoster, Live 10/30/2013    TDAP status: Due, Education has been provided regarding the importance of this vaccine. Advised may receive this vaccine at local pharmacy or Health Dept. Aware to provide a copy of the vaccination record if obtained from local pharmacy or Health Dept. Verbalized acceptance and understanding.  Flu Vaccine status: Declined, Education has been provided regarding the importance of this vaccine but patient still declined. Advised may receive this vaccine at local pharmacy or Health Dept. Aware to provide a copy of the vaccination record if obtained from local pharmacy or Health Dept. Verbalized acceptance and understanding.  Pneumococcal vaccine status: Up  to date  Covid-19 vaccine status: Completed vaccines  Qualifies for Shingles Vaccine? Yes   Zostavax completed Yes   Shingrix  Completed?: Yes  Screening Tests Health Maintenance  Topic Date Due   DTaP/Tdap/Td (1 - Tdap) Never done   COVID-19 Vaccine (3 - 2023-24 season) 04/07/2022   INFLUENZA VACCINE  11/05/2022 (Originally 03/07/2022)   Medicare Annual Wellness (AWV)  09/16/2023   MAMMOGRAM  12/17/2023   COLONOSCOPY (Pts 45-84yr Insurance coverage will need to be confirmed)  10/20/2027   Pneumonia Vaccine 70 Years old  Completed   DEXA SCAN  Completed   Hepatitis C Screening  Completed   Zoster Vaccines- Shingrix  Completed   HPV VACCINES  Aged Out    Health Maintenance  Health Maintenance Due  Topic Date Due   DTaP/Tdap/Td (1 - Tdap) Never done   COVID-19 Vaccine (3 - 2023-24 season) 04/07/2022    Colorectal cancer screening: Type of screening: Colonoscopy. Completed 10/19/2017. Repeat every 10 years  Mammogram status: Completed 12/16/2021. Repeat every year  Bone Density status: Completed 06/26/2013. Results reflect: Bone density results: OSTEOPENIA. Repeat every 2-3 years.  Lung Cancer Screening: (Low Dose CT Chest recommended if Age 70-80years, 30 pack-year currently smoking OR have quit w/in 15years.) does not qualify.   Lung Cancer Screening Referral: no  Additional Screening:  Hepatitis C Screening: does qualify; Completed 07/11/2016  Vision Screening: Recommended annual ophthalmology exams for early detection of glaucoma and other disorders of the eye. Is the patient up to date with their annual eye exam?  Yes  Who is the provider or what is the name of the office in which the patient attends annual eye exams? AMarcial Pacas OD. If pt is not established with a provider, would they like to be referred to a provider to establish care? No .   Dental Screening: Recommended annual dental exams for proper oral hygiene  Community Resource Referral / Chronic Care Management: CRR required this visit?  No   CCM required this visit?  No      Plan:     I have personally reviewed and  noted the following in the patient's chart:   Medical and social history Use of alcohol, tobacco or illicit drugs  Current medications and supplements including opioid prescriptions. Patient is not currently taking opioid prescriptions. Functional ability and status Nutritional status Physical activity Advanced directives List of other physicians Hospitalizations, surgeries, and ER visits in previous 12 months Vitals Screenings to include cognitive, depression, and falls Referrals and appointments  In addition, I have reviewed and discussed with patient certain preventive protocols, quality metrics, and best practice recommendations. A written personalized care plan for preventive services as well as general preventive health recommendations were provided to patient.     SSheral Flow LPN   [id number]  Nurse Notes:  Normal cognitive status assessed by direct observation by this Nurse Health Advisor. No abnormalities found.

## 2022-09-27 DIAGNOSIS — H2513 Age-related nuclear cataract, bilateral: Secondary | ICD-10-CM | POA: Diagnosis not present

## 2022-09-27 DIAGNOSIS — H43391 Other vitreous opacities, right eye: Secondary | ICD-10-CM | POA: Diagnosis not present

## 2022-10-13 ENCOUNTER — Encounter: Payer: Self-pay | Admitting: Internal Medicine

## 2022-11-29 DIAGNOSIS — Z6832 Body mass index (BMI) 32.0-32.9, adult: Secondary | ICD-10-CM | POA: Diagnosis not present

## 2022-11-29 DIAGNOSIS — Z01419 Encounter for gynecological examination (general) (routine) without abnormal findings: Secondary | ICD-10-CM | POA: Diagnosis not present

## 2022-12-05 ENCOUNTER — Other Ambulatory Visit: Payer: Self-pay | Admitting: Obstetrics and Gynecology

## 2022-12-05 DIAGNOSIS — Z1231 Encounter for screening mammogram for malignant neoplasm of breast: Secondary | ICD-10-CM

## 2023-01-12 ENCOUNTER — Ambulatory Visit
Admission: RE | Admit: 2023-01-12 | Discharge: 2023-01-12 | Disposition: A | Discharge status: Home / Self Care | Payer: Medicare Other | Source: Ambulatory Visit | Attending: Obstetrics and Gynecology | Admitting: Obstetrics and Gynecology

## 2023-01-12 DIAGNOSIS — Z1231 Encounter for screening mammogram for malignant neoplasm of breast: Secondary | ICD-10-CM

## 2023-06-26 ENCOUNTER — Ambulatory Visit: Payer: Medicare Other | Admitting: Internal Medicine

## 2023-06-27 DIAGNOSIS — D225 Melanocytic nevi of trunk: Secondary | ICD-10-CM | POA: Diagnosis not present

## 2023-06-27 DIAGNOSIS — D224 Melanocytic nevi of scalp and neck: Secondary | ICD-10-CM | POA: Diagnosis not present

## 2023-06-27 DIAGNOSIS — L814 Other melanin hyperpigmentation: Secondary | ICD-10-CM | POA: Diagnosis not present

## 2023-06-27 DIAGNOSIS — D1801 Hemangioma of skin and subcutaneous tissue: Secondary | ICD-10-CM | POA: Diagnosis not present

## 2023-06-27 DIAGNOSIS — D2371 Other benign neoplasm of skin of right lower limb, including hip: Secondary | ICD-10-CM | POA: Diagnosis not present

## 2023-06-27 DIAGNOSIS — L438 Other lichen planus: Secondary | ICD-10-CM | POA: Diagnosis not present

## 2023-06-27 DIAGNOSIS — L821 Other seborrheic keratosis: Secondary | ICD-10-CM | POA: Diagnosis not present

## 2023-06-27 DIAGNOSIS — L218 Other seborrheic dermatitis: Secondary | ICD-10-CM | POA: Diagnosis not present

## 2023-08-10 ENCOUNTER — Encounter: Payer: Medicare Other | Admitting: Internal Medicine

## 2023-08-29 ENCOUNTER — Ambulatory Visit: Payer: Medicare Other | Admitting: Internal Medicine

## 2023-08-29 ENCOUNTER — Encounter: Payer: Self-pay | Admitting: Internal Medicine

## 2023-08-29 VITALS — BP 118/72 | HR 70 | Temp 97.6°F | Ht 67.0 in | Wt 204.2 lb

## 2023-08-29 DIAGNOSIS — M8580 Other specified disorders of bone density and structure, unspecified site: Secondary | ICD-10-CM | POA: Diagnosis not present

## 2023-08-29 DIAGNOSIS — R030 Elevated blood-pressure reading, without diagnosis of hypertension: Secondary | ICD-10-CM | POA: Diagnosis not present

## 2023-08-29 DIAGNOSIS — E782 Mixed hyperlipidemia: Secondary | ICD-10-CM

## 2023-08-29 DIAGNOSIS — R7309 Other abnormal glucose: Secondary | ICD-10-CM | POA: Diagnosis not present

## 2023-08-29 DIAGNOSIS — E559 Vitamin D deficiency, unspecified: Secondary | ICD-10-CM | POA: Diagnosis not present

## 2023-08-29 LAB — CBC
HCT: 42.7 % (ref 36.0–46.0)
Hemoglobin: 14.1 g/dL (ref 12.0–15.0)
MCHC: 32.9 g/dL (ref 30.0–36.0)
MCV: 96.5 fL (ref 78.0–100.0)
Platelets: 279 10*3/uL (ref 150.0–400.0)
RBC: 4.43 Mil/uL (ref 3.87–5.11)
RDW: 13.1 % (ref 11.5–15.5)
WBC: 6.1 10*3/uL (ref 4.0–10.5)

## 2023-08-29 LAB — COMPREHENSIVE METABOLIC PANEL
ALT: 32 U/L (ref 0–35)
AST: 25 U/L (ref 0–37)
Albumin: 4.4 g/dL (ref 3.5–5.2)
Alkaline Phosphatase: 65 U/L (ref 39–117)
BUN: 15 mg/dL (ref 6–23)
CO2: 30 meq/L (ref 19–32)
Calcium: 8.9 mg/dL (ref 8.4–10.5)
Chloride: 102 meq/L (ref 96–112)
Creatinine, Ser: 0.75 mg/dL (ref 0.40–1.20)
GFR: 80.51 mL/min (ref >60.00)
Glucose, Bld: 91 mg/dL (ref 70–99)
Potassium: 4.1 meq/L (ref 3.5–5.1)
Sodium: 138 meq/L (ref 135–145)
Total Bilirubin: 0.7 mg/dL (ref 0.2–1.2)
Total Protein: 7.2 g/dL (ref 6.0–8.3)

## 2023-08-29 LAB — LIPID PANEL
Cholesterol: 202 mg/dL — ABNORMAL HIGH (ref 0–200)
HDL: 75.9 mg/dL (ref >39.00)
LDL Cholesterol: 109 mg/dL — ABNORMAL HIGH (ref 0–99)
NonHDL: 125.72
Total CHOL/HDL Ratio: 3
Triglycerides: 82 mg/dL (ref 0.0–149.0)
VLDL: 16.4 mg/dL (ref 0.0–40.0)

## 2023-08-29 LAB — HEMOGLOBIN A1C: Hgb A1c MFr Bld: 5.7 % (ref 4.6–6.5)

## 2023-08-29 LAB — VITAMIN D 25 HYDROXY (VIT D DEFICIENCY, FRACTURES): VITD: 70.87 ng/mL (ref 30.00–100.00)

## 2023-08-29 LAB — VITAMIN B12: Vitamin B-12: 523 pg/mL (ref 211–911)

## 2023-08-29 MED ORDER — HYDROCORTISONE (PERIANAL) 2.5 % EX CREA: 1.0000 | TOPICAL_CREAM | Freq: Two times a day (BID) | CUTANEOUS | 11 refills | Status: AC

## 2023-08-29 NOTE — Assessment & Plan Note (Signed)
2 isolated high readings since last year. She monitors and average 120s/80s. Counseled to keep checking a few times a month and alert [place] if this changes.

## 2023-08-29 NOTE — Assessment & Plan Note (Signed)
Checking HGA1c and counseled on this measure and how it checks sugars and ranges for normal, pre-diabetes and diabetes.

## 2023-08-29 NOTE — Patient Instructions (Signed)
We will check the labs today. 

## 2023-08-29 NOTE — Assessment & Plan Note (Signed)
Likely overdue for DEXA last maybe 2019 at ob/gyn. She will see them in April and will get this done and make sure we get records. Encouraged weight bearing exercise at least 3 times a week and adequate calcium and vitamin d. Checking vitamin D levels and CMP for calcium levels.

## 2023-08-29 NOTE — Progress Notes (Signed)
   Subjective:   Patient ID: Sharon Mcgee, female    DOB: November 06, 1950, 73 y.o.   MRN: [id number]  HPI The patient is a 73 YO female coming in for follow up/medical management. See A/P for details.  Review of Systems  Constitutional: Negative.   HENT: Negative.    Eyes: Negative.   Respiratory:  Negative for cough, chest tightness and shortness of breath.   Cardiovascular:  Negative for chest pain, palpitations and leg swelling.  Gastrointestinal:  Negative for abdominal distention, abdominal pain, constipation, diarrhea, nausea and vomiting.  Musculoskeletal: Negative.   Skin: Negative.   Neurological: Negative.   Psychiatric/Behavioral: Negative.      Objective:  Physical Exam Constitutional:      Appearance: She is well-developed.  HENT:     Head: Normocephalic and atraumatic.  Cardiovascular:     Rate and Rhythm: Normal rate and regular rhythm.  Pulmonary:     Effort: Pulmonary effort is normal. No respiratory distress.     Breath sounds: Normal breath sounds. No wheezing or rales.  Abdominal:     General: Bowel sounds are normal. There is no distension.     Palpations: Abdomen is soft.     Tenderness: There is no abdominal tenderness. There is no rebound.  Musculoskeletal:     Cervical back: Normal range of motion.  Skin:    General: Skin is warm and dry.  Neurological:     Mental Status: She is alert and oriented to person, place, and time.     Coordination: Coordination normal.     Vitals:   08/29/23 1001  BP: 118/72  Pulse: 70  Temp: 97.6 F (36.4 C)  TempSrc: Temporal  SpO2: 97%  Weight: 204 lb 4 oz (92.6 kg)  Height: 5\' 7"  (1.702 m)    Assessment & Plan:

## 2023-08-29 NOTE — Assessment & Plan Note (Signed)
Checking lipid panel as higher last year than prior. She is exercising now but this change is fairly recent. Adjust as needed.

## 2023-09-03 ENCOUNTER — Encounter: Payer: Self-pay | Admitting: Internal Medicine

## 2023-09-28 NOTE — Telephone Encounter (Signed)
 How do I send this to billing to take a look at?

## 2023-10-02 NOTE — Telephone Encounter (Signed)
 Is there anyway to correct this?

## 2023-10-03 DIAGNOSIS — H0288B Meibomian gland dysfunction left eye, upper and lower eyelids: Secondary | ICD-10-CM | POA: Diagnosis not present

## 2023-10-03 DIAGNOSIS — H0288A Meibomian gland dysfunction right eye, upper and lower eyelids: Secondary | ICD-10-CM | POA: Diagnosis not present

## 2023-10-03 DIAGNOSIS — H2513 Age-related nuclear cataract, bilateral: Secondary | ICD-10-CM | POA: Diagnosis not present

## 2023-10-03 DIAGNOSIS — H02831 Dermatochalasis of right upper eyelid: Secondary | ICD-10-CM | POA: Diagnosis not present

## 2023-10-03 DIAGNOSIS — H02834 Dermatochalasis of left upper eyelid: Secondary | ICD-10-CM | POA: Diagnosis not present

## 2023-10-03 DIAGNOSIS — H43391 Other vitreous opacities, right eye: Secondary | ICD-10-CM | POA: Diagnosis not present

## 2023-12-12 DIAGNOSIS — Z01419 Encounter for gynecological examination (general) (routine) without abnormal findings: Secondary | ICD-10-CM | POA: Diagnosis not present

## 2023-12-12 DIAGNOSIS — Z6832 Body mass index (BMI) 32.0-32.9, adult: Secondary | ICD-10-CM | POA: Diagnosis not present

## 2024-01-03 ENCOUNTER — Other Ambulatory Visit: Payer: Self-pay | Admitting: Obstetrics and Gynecology

## 2024-01-03 DIAGNOSIS — Z1231 Encounter for screening mammogram for malignant neoplasm of breast: Secondary | ICD-10-CM

## 2024-01-16 DIAGNOSIS — N958 Other specified menopausal and perimenopausal disorders: Secondary | ICD-10-CM | POA: Diagnosis not present

## 2024-01-16 DIAGNOSIS — M8588 Other specified disorders of bone density and structure, other site: Secondary | ICD-10-CM | POA: Diagnosis not present

## 2024-01-16 LAB — HM DEXA SCAN

## 2024-01-23 ENCOUNTER — Ambulatory Visit

## 2024-01-31 ENCOUNTER — Ambulatory Visit
Admission: RE | Admit: 2024-01-31 | Discharge: 2024-01-31 | Disposition: A | Discharge status: Home / Self Care | Source: Ambulatory Visit | Attending: Obstetrics and Gynecology | Admitting: Obstetrics and Gynecology

## 2024-01-31 DIAGNOSIS — Z1231 Encounter for screening mammogram for malignant neoplasm of breast: Secondary | ICD-10-CM

## 2024-02-04 ENCOUNTER — Ambulatory Visit: Payer: Self-pay | Admitting: Internal Medicine

## 2024-02-05 ENCOUNTER — Encounter: Payer: Self-pay | Admitting: Internal Medicine

## 2024-02-05 LAB — HM MAMMOGRAPHY

## 2024-02-25 ENCOUNTER — Encounter: Payer: Self-pay | Admitting: Obstetrics and Gynecology

## 2024-04-24 DIAGNOSIS — M8580 Other specified disorders of bone density and structure, unspecified site: Secondary | ICD-10-CM | POA: Diagnosis not present

## 2024-09-03 ENCOUNTER — Ambulatory Visit

## 2024-09-03 ENCOUNTER — Ambulatory Visit: Payer: Medicare Other | Admitting: Internal Medicine

## 2024-09-03 ENCOUNTER — Encounter: Payer: Self-pay | Admitting: Internal Medicine

## 2024-09-03 VITALS — BP 120/80 | HR 63 | Temp 97.9°F | Ht 65.0 in | Wt 211.2 lb

## 2024-09-03 DIAGNOSIS — G8929 Other chronic pain: Secondary | ICD-10-CM

## 2024-09-03 DIAGNOSIS — M546 Pain in thoracic spine: Secondary | ICD-10-CM | POA: Diagnosis not present

## 2024-09-03 DIAGNOSIS — M549 Dorsalgia, unspecified: Secondary | ICD-10-CM

## 2024-09-03 DIAGNOSIS — M545 Low back pain, unspecified: Secondary | ICD-10-CM

## 2024-09-03 DIAGNOSIS — K219 Gastro-esophageal reflux disease without esophagitis: Secondary | ICD-10-CM | POA: Diagnosis not present

## 2024-09-03 DIAGNOSIS — R7309 Other abnormal glucose: Secondary | ICD-10-CM

## 2024-09-03 DIAGNOSIS — M8580 Other specified disorders of bone density and structure, unspecified site: Secondary | ICD-10-CM

## 2024-09-03 DIAGNOSIS — Z13220 Encounter for screening for lipoid disorders: Secondary | ICD-10-CM

## 2024-09-03 DIAGNOSIS — J310 Chronic rhinitis: Secondary | ICD-10-CM

## 2024-09-03 DIAGNOSIS — E559 Vitamin D deficiency, unspecified: Secondary | ICD-10-CM | POA: Diagnosis not present

## 2024-09-03 LAB — COMPREHENSIVE METABOLIC PANEL WITH GFR
ALT: 27 U/L (ref 3–35)
AST: 22 U/L (ref 5–37)
Albumin: 4.2 g/dL (ref 3.5–5.2)
Alkaline Phosphatase: 59 U/L (ref 39–117)
BUN: 13 mg/dL (ref 6–23)
CO2: 28 meq/L (ref 19–32)
Calcium: 8.9 mg/dL (ref 8.4–10.5)
Chloride: 104 meq/L (ref 96–112)
Creatinine, Ser: 0.72 mg/dL (ref 0.40–1.20)
GFR: 83.95 mL/min
Glucose, Bld: 98 mg/dL (ref 70–99)
Potassium: 4.1 meq/L (ref 3.5–5.1)
Sodium: 139 meq/L (ref 135–145)
Total Bilirubin: 0.6 mg/dL (ref 0.2–1.2)
Total Protein: 7 g/dL (ref 6.0–8.3)

## 2024-09-03 LAB — LIPID PANEL
Cholesterol: 193 mg/dL (ref 28–200)
HDL: 71.5 mg/dL
LDL Cholesterol: 104 mg/dL — ABNORMAL HIGH (ref 10–99)
NonHDL: 121.46
Total CHOL/HDL Ratio: 3
Triglycerides: 86 mg/dL (ref 10.0–149.0)
VLDL: 17.2 mg/dL (ref 0.0–40.0)

## 2024-09-03 LAB — VITAMIN D 25 HYDROXY (VIT D DEFICIENCY, FRACTURES): VITD: 75.08 ng/mL (ref 30.00–100.00)

## 2024-09-03 LAB — CBC
HCT: 40.2 % (ref 36.0–46.0)
Hemoglobin: 13.7 g/dL (ref 12.0–15.0)
MCHC: 34 g/dL (ref 30.0–36.0)
MCV: 94.1 fl (ref 78.0–100.0)
Platelets: 231 10*3/uL (ref 150.0–400.0)
RBC: 4.27 Mil/uL (ref 3.87–5.11)
RDW: 13.1 % (ref 11.5–15.5)
WBC: 5.9 10*3/uL (ref 4.0–10.5)

## 2024-09-03 LAB — HEMOGLOBIN A1C: Hgb A1c MFr Bld: 5.6 % (ref 4.6–6.5)

## 2024-09-03 LAB — VITAMIN B12: Vitamin B-12: 677 pg/mL (ref 211–911)

## 2024-09-03 NOTE — Patient Instructions (Addendum)
 We will check the labs and the x-ray today. You can try zyrtec (cetirizine) over the counter for the nasal drip.

## 2024-09-05 ENCOUNTER — Ambulatory Visit: Payer: Self-pay | Admitting: Internal Medicine

## 2024-09-05 ENCOUNTER — Encounter: Payer: Self-pay | Admitting: Internal Medicine

## 2024-09-05 DIAGNOSIS — M549 Dorsalgia, unspecified: Secondary | ICD-10-CM | POA: Insufficient documentation

## 2024-09-05 DIAGNOSIS — J310 Chronic rhinitis: Secondary | ICD-10-CM | POA: Insufficient documentation

## 2024-09-05 DIAGNOSIS — M545 Low back pain, unspecified: Secondary | ICD-10-CM | POA: Insufficient documentation

## 2025-09-09 ENCOUNTER — Encounter: Admitting: Internal Medicine
